# Patient Record
Sex: Female | Born: 1945 | Race: White | Hispanic: No | Smoking: Never smoker
Health system: Southern US, Community
[De-identification: ages and names within clinical notes are randomized; demographics above are authoritative.]

## PROBLEM LIST (undated history)

## (undated) DIAGNOSIS — A481 Legionnaires' disease: Secondary | ICD-10-CM

## (undated) DIAGNOSIS — E039 Hypothyroidism, unspecified: Secondary | ICD-10-CM

## (undated) DIAGNOSIS — F419 Anxiety disorder, unspecified: Secondary | ICD-10-CM

## (undated) DIAGNOSIS — E876 Hypokalemia: Secondary | ICD-10-CM

## (undated) DIAGNOSIS — R059 Cough, unspecified: Secondary | ICD-10-CM

## (undated) DIAGNOSIS — H3411 Central retinal artery occlusion, right eye: Secondary | ICD-10-CM

## (undated) DIAGNOSIS — K219 Gastro-esophageal reflux disease without esophagitis: Secondary | ICD-10-CM

## (undated) DIAGNOSIS — F32A Depression, unspecified: Secondary | ICD-10-CM

## (undated) DIAGNOSIS — F329 Major depressive disorder, single episode, unspecified: Secondary | ICD-10-CM

## (undated) DIAGNOSIS — I10 Essential (primary) hypertension: Secondary | ICD-10-CM

## (undated) DIAGNOSIS — R05 Cough: Secondary | ICD-10-CM

## (undated) DIAGNOSIS — E119 Type 2 diabetes mellitus without complications: Secondary | ICD-10-CM

## (undated) DIAGNOSIS — E785 Hyperlipidemia, unspecified: Secondary | ICD-10-CM

## (undated) HISTORY — DX: Central retinal artery occlusion, right eye: H34.11

## (undated) HISTORY — DX: Hypokalemia: E87.6

## (undated) HISTORY — DX: Cough: R05

## (undated) HISTORY — DX: Type 2 diabetes mellitus without complications: E11.9

## (undated) HISTORY — DX: Hyperlipidemia, unspecified: E78.5

## (undated) HISTORY — DX: Cough, unspecified: R05.9

---

## 1998-05-15 ENCOUNTER — Inpatient Hospital Stay (HOSPITAL_COMMUNITY): Admission: EM | Admit: 1998-05-15 | Discharge: 1998-05-16 | Payer: Self-pay | Admitting: Family Medicine

## 2000-01-10 ENCOUNTER — Encounter: Payer: Self-pay | Admitting: Obstetrics and Gynecology

## 2000-01-10 ENCOUNTER — Encounter: Admission: RE | Admit: 2000-01-10 | Discharge: 2000-01-10 | Payer: Self-pay | Admitting: Obstetrics and Gynecology

## 2002-06-11 ENCOUNTER — Emergency Department (HOSPITAL_COMMUNITY): Admission: EM | Admit: 2002-06-11 | Discharge: 2002-06-11 | Payer: Self-pay | Admitting: Emergency Medicine

## 2002-06-11 ENCOUNTER — Encounter: Payer: Self-pay | Admitting: Emergency Medicine

## 2002-06-21 ENCOUNTER — Encounter (HOSPITAL_COMMUNITY): Admission: RE | Admit: 2002-06-21 | Discharge: 2002-09-19 | Payer: Self-pay | Admitting: Family Medicine

## 2002-06-22 ENCOUNTER — Encounter: Payer: Self-pay | Admitting: Family Medicine

## 2002-06-28 ENCOUNTER — Encounter: Payer: Self-pay | Admitting: Family Medicine

## 2002-06-28 ENCOUNTER — Ambulatory Visit (HOSPITAL_COMMUNITY): Admission: RE | Admit: 2002-06-28 | Discharge: 2002-06-28 | Payer: Self-pay | Admitting: Family Medicine

## 2002-07-19 ENCOUNTER — Ambulatory Visit (HOSPITAL_COMMUNITY): Admission: RE | Admit: 2002-07-19 | Discharge: 2002-07-19 | Payer: Self-pay | Admitting: Family Medicine

## 2002-07-19 ENCOUNTER — Encounter: Payer: Self-pay | Admitting: Family Medicine

## 2002-07-19 ENCOUNTER — Encounter (INDEPENDENT_AMBULATORY_CARE_PROVIDER_SITE_OTHER): Payer: Self-pay | Admitting: *Deleted

## 2004-10-12 ENCOUNTER — Ambulatory Visit: Payer: Self-pay | Admitting: Family Medicine

## 2005-02-07 ENCOUNTER — Ambulatory Visit: Payer: Self-pay | Admitting: Family Medicine

## 2006-01-30 ENCOUNTER — Ambulatory Visit: Payer: Self-pay | Admitting: Family Medicine

## 2006-06-24 ENCOUNTER — Ambulatory Visit: Payer: Self-pay | Admitting: Family Medicine

## 2006-08-20 ENCOUNTER — Ambulatory Visit: Payer: Self-pay | Admitting: Family Medicine

## 2007-10-19 ENCOUNTER — Ambulatory Visit: Payer: Self-pay | Admitting: Internal Medicine

## 2007-10-19 DIAGNOSIS — R05 Cough: Secondary | ICD-10-CM

## 2007-11-02 ENCOUNTER — Ambulatory Visit: Payer: Self-pay | Admitting: Internal Medicine

## 2007-11-03 ENCOUNTER — Ambulatory Visit: Payer: Self-pay | Admitting: Cardiovascular Disease

## 2007-11-03 ENCOUNTER — Telehealth: Payer: Self-pay | Admitting: Internal Medicine

## 2007-11-05 ENCOUNTER — Ambulatory Visit: Payer: Self-pay | Admitting: Internal Medicine

## 2007-11-19 ENCOUNTER — Ambulatory Visit: Payer: Self-pay | Admitting: Internal Medicine

## 2007-12-01 ENCOUNTER — Telehealth: Payer: Self-pay | Admitting: Internal Medicine

## 2008-01-04 ENCOUNTER — Ambulatory Visit: Payer: Self-pay | Admitting: Internal Medicine

## 2008-01-04 ENCOUNTER — Encounter: Payer: Self-pay | Admitting: Internal Medicine

## 2008-01-04 DIAGNOSIS — I10 Essential (primary) hypertension: Secondary | ICD-10-CM | POA: Insufficient documentation

## 2008-02-08 ENCOUNTER — Ambulatory Visit: Payer: Self-pay | Admitting: Internal Medicine

## 2008-05-20 ENCOUNTER — Ambulatory Visit: Payer: Self-pay | Admitting: Internal Medicine

## 2008-08-08 ENCOUNTER — Ambulatory Visit: Payer: Self-pay | Admitting: Internal Medicine

## 2008-09-05 ENCOUNTER — Ambulatory Visit: Payer: Self-pay | Admitting: Internal Medicine

## 2008-09-05 ENCOUNTER — Telehealth (INDEPENDENT_AMBULATORY_CARE_PROVIDER_SITE_OTHER): Payer: Self-pay | Admitting: *Deleted

## 2008-09-20 ENCOUNTER — Telehealth (INDEPENDENT_AMBULATORY_CARE_PROVIDER_SITE_OTHER): Payer: Self-pay | Admitting: *Deleted

## 2008-09-26 ENCOUNTER — Ambulatory Visit: Payer: Self-pay | Admitting: Internal Medicine

## 2009-03-01 ENCOUNTER — Telehealth (INDEPENDENT_AMBULATORY_CARE_PROVIDER_SITE_OTHER): Payer: Self-pay | Admitting: *Deleted

## 2009-08-02 ENCOUNTER — Encounter: Payer: Self-pay | Admitting: Internal Medicine

## 2009-08-09 ENCOUNTER — Ambulatory Visit: Payer: Self-pay | Admitting: Internal Medicine

## 2009-09-25 ENCOUNTER — Encounter: Admission: RE | Admit: 2009-09-25 | Discharge: 2009-09-25 | Payer: Self-pay | Admitting: Family Medicine

## 2010-08-10 ENCOUNTER — Emergency Department (HOSPITAL_BASED_OUTPATIENT_CLINIC_OR_DEPARTMENT_OTHER): Admission: EM | Admit: 2010-08-10 | Discharge: 2010-08-10 | Payer: Self-pay | Admitting: Emergency Medicine

## 2010-08-10 ENCOUNTER — Ambulatory Visit: Payer: Self-pay | Admitting: Diagnostic Radiology

## 2010-08-14 ENCOUNTER — Encounter: Admission: RE | Admit: 2010-08-14 | Discharge: 2010-08-14 | Payer: Self-pay | Admitting: Orthopaedic Surgery

## 2011-05-14 ENCOUNTER — Other Ambulatory Visit: Payer: Self-pay | Admitting: Family Medicine

## 2011-05-14 DIAGNOSIS — Z1231 Encounter for screening mammogram for malignant neoplasm of breast: Secondary | ICD-10-CM

## 2011-05-30 ENCOUNTER — Ambulatory Visit: Payer: Self-pay

## 2011-08-07 ENCOUNTER — Ambulatory Visit: Payer: Self-pay

## 2011-12-23 ENCOUNTER — Ambulatory Visit
Admission: RE | Admit: 2011-12-23 | Discharge: 2011-12-23 | Disposition: A | Discharge status: Home / Self Care | Payer: Self-pay | Source: Ambulatory Visit | Attending: Family Medicine | Admitting: Family Medicine

## 2011-12-23 DIAGNOSIS — Z1231 Encounter for screening mammogram for malignant neoplasm of breast: Secondary | ICD-10-CM

## 2012-01-02 ENCOUNTER — Telehealth: Payer: Self-pay | Admitting: Internal Medicine

## 2012-01-02 NOTE — Telephone Encounter (Signed)
Spoke with Doroteo Bradford. She states pt can not speak b/c she is having so much diff with her breathing and also c/o cough. She is taking her to ED or UC right now. I have sched her for rov with MW to followup 01-15-12. Doroteo Bradford states nothing needed.

## 2012-01-14 ENCOUNTER — Encounter: Payer: Self-pay | Admitting: Internal Medicine

## 2012-01-14 ENCOUNTER — Other Ambulatory Visit: Payer: Self-pay | Admitting: Internal Medicine

## 2012-01-15 ENCOUNTER — Ambulatory Visit (INDEPENDENT_AMBULATORY_CARE_PROVIDER_SITE_OTHER): Payer: 59 | Admitting: Internal Medicine

## 2012-01-15 ENCOUNTER — Encounter: Payer: Self-pay | Admitting: Internal Medicine

## 2012-01-15 VITALS — BP 142/78 | HR 93 | Temp 98.1°F | Ht 60.0 in | Wt 166.6 lb

## 2012-01-15 DIAGNOSIS — J45909 Unspecified asthma, uncomplicated: Secondary | ICD-10-CM

## 2012-01-15 NOTE — Patient Instructions (Signed)
No change in maintenance medications  Work on perfecting  inhaler technique:  relax and gently blow all the way out then take a nice smooth deep breath back in, triggering the inhaler at same time you start breathing in.  Hold for up to 5 seconds if you can.  Rinse and gargle with water when done   If your mouth or throat starts to bother you,   I suggest you time the inhaler to your dental care and after using the inhaler(s) brush teeth and tongue with a baking soda containing toothpaste and when you rinse this out, gargle with it first to see if this helps your mouth and throat.     If you are satisfied with your treatment plan let your doctor know and he/she can either refill your medications or you can return here when your prescription runs out.     If in any way you are not 100% satisfied,  please tell us.  If 100% better, tell your friends!

## 2012-01-15 NOTE — Progress Notes (Signed)
Subjective:     Patient ID: Vickie Chang, female   DOB: Jan 14, 1946   MRN: [id number]  HPI  33 yowf  never smoker who apparently had pneumonia at the age of 36 months and since then has never been perfectly healthy in terms of respiratory complaints but much worse since she was diagnosed with mycoplasma pneumonia in [place] in 1984 - did not require intubation but apparently was quite sick.   Seen for pulmonary consult 10/19/2007 for persistent cough and wheezing. She was changed from Advair to Symbicort. CT sinus which was negative. .   02/08/08 ov reported breathing the best she's felt in years with minimum need for p.r.n.   September 05, 2008--returns for cough over last 4 weeks. Since flu shots she has had return of cough, has been having reaction to shots- with fatigue, malaise, cough, nausea. does not want cough to get as bad as before. rx with prednsone and tramadol, improved but not back to baseline, then relapsed but not as bad as was.   September 26, 2008 ov mostly night time coughing, never able to eliminate tramdol since it was originally started and using it as first line rather than as a backup as instucted.   August 09, 2009 cc f/u ov  did fine with maint qvar 40 2 bid and no need for ventolin even flew to [place] withot problems until sinus infection in late Oct 2010 . Pt c/o chest congestion, prod cough with green sputum x 2 days. She has already been txed with zpack and levaquin for "bronchitis". Also c/o fever for the past several nights. Presently finishing 750 (day 10 today) and also on prednisone. rec Work on hfa/ max gerd rx  01/15/2012 f/u ov/Vickie Chang cc recurrent cough> sob, acute onset, persistent daily > nightly, mostly dry , lost husband 11/2011, alot of stress,went to urgent care,started on antibotic,cough-better,sob better. No overt hb or sinus symptoms  Sleeping ok without nocturnal  or early am exacerbation  of respiratory  c/o's or need for noct saba. Also denies any  obvious fluctuation of symptoms with weather or environmental changes or other aggravating or alleviating factors except as outlined above   ROS  At present neg for  any significant sore throat, dysphagia, dental problems, itching, sneezing,  nasal congestion or excess/ purulent secretions, ear ache,   fever, chills, sweats, unintended wt loss, pleuritic or exertional cp, hemoptysis, palpitations, orthopnea pnd or leg swelling.  Also denies presyncope, palpitations, heartburn, abdominal pain, anorexia, nausea, vomiting, diarrhea  or change in bowel or urinary habits, change in stools or urine, dysuria,hematuria,  rash, arthralgias, visual complaints, headache, numbness weakness or ataxia or problems with walking or coordination. No noted change in mood/affect or memory.                         Allergies  1) ! Sulfa  2) ! Pcn  3) Codeine   Past Medical History:  INTRINSIC ASTHMA, UNSPECIFIED (ICD-493.10)  - HFA 100% effective September 26, 2008 > 25% August 09, 2009  COUGH (ICD-786.2)  - Sinus CT neg 11/03/07     Review of Systems     Objective:   Physical Exam    Hoarse anxious wf nad NAD wt 176 > 176 09/27/07 > 175 August 09, 2009 > 166 01/15/2012  HEENT: [place]/AT, , EACs-clear, TMs-wnl, NOSE-clear, THROAT-clear  NECK: Supple w/ fair ROM; no JVD; normal carotid impulses w/o bruits; no thyromegaly or nodules palpated; no lymphadenopathy.  CHEST: Clear to P & A; w/o, wheezes/ rales/ or rhonchi.no psuedowheezing  HEART: RRR, no m/r/g  ABDOMEN: Soft & nt; nml bowel sounds; no organomegaly or masses detected.  EXT: Warm bil, no calf pain, edema, clubbing, pulses intac Assessment:          Plan:

## 2012-01-16 NOTE — Assessment & Plan Note (Addendum)
Symptoms are markedly disproportionate to objective findings and not clear this is a lung problem but pt does appear to have difficult airway management issues.  DDX of  difficult airways managment all start with A and  include Adherence, Ace Inhibitors, Acid Reflux, Active Sinus Disease, Alpha 1 Antitripsin deficiency, Anxiety masquerading as Airways dz,  ABPA,  allergy(esp in young), Aspiration (esp in elderly), Adverse effects of DPI,  Active smokers, plus two Bs  = Bronchiectasis and Beta blocker use..and one C= CHF  Adherence is always the initial "prime suspect" and is a multilayered concern that requires a "trust but verify" approach in every patient - starting with knowing how to use medications, especially inhalers, correctly, keeping up with refills and understanding the fundamental difference between maintenance and prns vs those medications only taken for a very short course and then stopped and not refilled. The proper method of use, as well as anticipated side effects, of a metered-dose inhaler are discussed and demonstrated to the patient. Improved effectiveness after extensive coaching during this visit to a level of approximately  75%.  ? Acid reflux > reviewed diet/ rx.    Each maintenance medication was reviewed in detail including most importantly the difference between maintenance and as needed and under what circumstances the prns are to be used. This was done in the context of a medication calendar review which provided the patient with a user-friendly unambiguous mechanism for medication administration and reconciliation and provides an action plan for all active problems. It is critical that this be shown to every doctor  for modification during the office visit if necessary so the patient can use it as a working document.

## 2013-02-01 ENCOUNTER — Other Ambulatory Visit: Payer: Self-pay

## 2013-02-01 DIAGNOSIS — Z1231 Encounter for screening mammogram for malignant neoplasm of breast: Secondary | ICD-10-CM

## 2013-06-02 ENCOUNTER — Ambulatory Visit: Payer: 59

## 2014-01-19 ENCOUNTER — Ambulatory Visit: Payer: 59

## 2014-12-29 ENCOUNTER — Ambulatory Visit
Admission: RE | Admit: 2014-12-29 | Discharge: 2014-12-29 | Disposition: A | Discharge status: Home / Self Care | Payer: Medicare Other | Source: Ambulatory Visit

## 2014-12-29 DIAGNOSIS — Z1231 Encounter for screening mammogram for malignant neoplasm of breast: Secondary | ICD-10-CM

## 2015-01-02 ENCOUNTER — Other Ambulatory Visit: Payer: Self-pay | Admitting: Family Medicine

## 2015-01-02 DIAGNOSIS — R928 Other abnormal and inconclusive findings on diagnostic imaging of breast: Secondary | ICD-10-CM

## 2015-01-05 ENCOUNTER — Ambulatory Visit
Admission: RE | Admit: 2015-01-05 | Discharge: 2015-01-05 | Disposition: A | Discharge status: Home / Self Care | Payer: Medicare Other | Source: Ambulatory Visit | Attending: Family Medicine | Admitting: Family Medicine

## 2015-01-05 DIAGNOSIS — R928 Other abnormal and inconclusive findings on diagnostic imaging of breast: Secondary | ICD-10-CM

## 2015-04-27 ENCOUNTER — Inpatient Hospital Stay (HOSPITAL_BASED_OUTPATIENT_CLINIC_OR_DEPARTMENT_OTHER)
Admission: EM | Admit: 2015-04-27 | Discharge: 2015-04-28 | DRG: 123 | Disposition: A | Discharge status: Home / Self Care | Payer: Medicare Other | Attending: Internal Medicine | Admitting: Internal Medicine

## 2015-04-27 ENCOUNTER — Encounter (HOSPITAL_BASED_OUTPATIENT_CLINIC_OR_DEPARTMENT_OTHER): Payer: Self-pay | Admitting: *Deleted

## 2015-04-27 DIAGNOSIS — J45909 Unspecified asthma, uncomplicated: Secondary | ICD-10-CM | POA: Diagnosis not present

## 2015-04-27 DIAGNOSIS — H3411 Central retinal artery occlusion, right eye: Secondary | ICD-10-CM | POA: Diagnosis not present

## 2015-04-27 DIAGNOSIS — Z79899 Other long term (current) drug therapy: Secondary | ICD-10-CM | POA: Diagnosis not present

## 2015-04-27 DIAGNOSIS — E119 Type 2 diabetes mellitus without complications: Secondary | ICD-10-CM | POA: Diagnosis present

## 2015-04-27 DIAGNOSIS — E039 Hypothyroidism, unspecified: Secondary | ICD-10-CM | POA: Diagnosis present

## 2015-04-27 DIAGNOSIS — F32A Depression, unspecified: Secondary | ICD-10-CM | POA: Insufficient documentation

## 2015-04-27 DIAGNOSIS — I10 Essential (primary) hypertension: Secondary | ICD-10-CM | POA: Diagnosis not present

## 2015-04-27 DIAGNOSIS — H538 Other visual disturbances: Secondary | ICD-10-CM | POA: Diagnosis present

## 2015-04-27 DIAGNOSIS — J4520 Mild intermittent asthma, uncomplicated: Secondary | ICD-10-CM

## 2015-04-27 DIAGNOSIS — Z880 Allergy status to penicillin: Secondary | ICD-10-CM | POA: Diagnosis not present

## 2015-04-27 DIAGNOSIS — E876 Hypokalemia: Secondary | ICD-10-CM | POA: Diagnosis not present

## 2015-04-27 DIAGNOSIS — H349 Unspecified retinal vascular occlusion: Secondary | ICD-10-CM | POA: Diagnosis present

## 2015-04-27 DIAGNOSIS — E785 Hyperlipidemia, unspecified: Secondary | ICD-10-CM | POA: Diagnosis not present

## 2015-04-27 DIAGNOSIS — H539 Unspecified visual disturbance: Secondary | ICD-10-CM | POA: Insufficient documentation

## 2015-04-27 DIAGNOSIS — Z7982 Long term (current) use of aspirin: Secondary | ICD-10-CM

## 2015-04-27 DIAGNOSIS — G459 Transient cerebral ischemic attack, unspecified: Secondary | ICD-10-CM

## 2015-04-27 DIAGNOSIS — K219 Gastro-esophageal reflux disease without esophagitis: Secondary | ICD-10-CM | POA: Diagnosis present

## 2015-04-27 DIAGNOSIS — F419 Anxiety disorder, unspecified: Secondary | ICD-10-CM | POA: Diagnosis not present

## 2015-04-27 DIAGNOSIS — H3410 Central retinal artery occlusion, unspecified eye: Secondary | ICD-10-CM

## 2015-04-27 DIAGNOSIS — Z882 Allergy status to sulfonamides status: Secondary | ICD-10-CM | POA: Diagnosis not present

## 2015-04-27 DIAGNOSIS — F329 Major depressive disorder, single episode, unspecified: Secondary | ICD-10-CM | POA: Diagnosis not present

## 2015-04-27 DIAGNOSIS — Z885 Allergy status to narcotic agent status: Secondary | ICD-10-CM

## 2015-04-27 HISTORY — DX: Essential (primary) hypertension: I10

## 2015-04-27 HISTORY — DX: Gastro-esophageal reflux disease without esophagitis: K21.9

## 2015-04-27 HISTORY — DX: Anxiety disorder, unspecified: F41.9

## 2015-04-27 HISTORY — DX: Legionnaires' disease: A48.1

## 2015-04-27 HISTORY — DX: Hypothyroidism, unspecified: E03.9

## 2015-04-27 HISTORY — DX: Major depressive disorder, single episode, unspecified: F32.9

## 2015-04-27 HISTORY — DX: Depression, unspecified: F32.A

## 2015-04-27 LAB — COMPREHENSIVE METABOLIC PANEL
ALT: 27 U/L (ref 14–54)
AST: 32 U/L (ref 15–41)
Albumin: 4 g/dL (ref 3.5–5.0)
Alkaline Phosphatase: 54 U/L (ref 38–126)
Anion gap: 9 (ref 5–15)
BUN: 10 mg/dL (ref 6–20)
CO2: 28 mmol/L (ref 22–32)
CREATININE: 0.89 mg/dL (ref 0.44–1.00)
Calcium: 9.2 mg/dL (ref 8.9–10.3)
Chloride: 99 mmol/L — ABNORMAL LOW (ref 101–111)
GFR calc Af Amer: >60 mL/min (ref >60)
GFR calc non Af Amer: >60 mL/min (ref >60)
Glucose, Bld: 176 mg/dL — ABNORMAL HIGH (ref 65–99)
POTASSIUM: 3 mmol/L — AB (ref 3.5–5.1)
Sodium: 136 mmol/L (ref 135–145)
Total Bilirubin: 0.6 mg/dL (ref 0.3–1.2)
Total Protein: 6.4 g/dL — ABNORMAL LOW (ref 6.5–8.1)

## 2015-04-27 LAB — CBC WITH DIFFERENTIAL/PLATELET
BASOS ABS: 0 10*3/uL (ref 0.0–0.1)
Basophils Relative: 1 % (ref 0–1)
EOS ABS: 0.1 10*3/uL (ref 0.0–0.7)
Eosinophils Relative: 1 % (ref 0–5)
HCT: 42.4 % (ref 36.0–46.0)
Hemoglobin: 14.8 g/dL (ref 12.0–15.0)
LYMPHS PCT: 18 % (ref 12–46)
Lymphs Abs: 1.3 10*3/uL (ref 0.7–4.0)
MCH: 29.2 pg (ref 26.0–34.0)
MCHC: 34.9 g/dL (ref 30.0–36.0)
MCV: 83.6 fL (ref 78.0–100.0)
Monocytes Absolute: 0.6 10*3/uL (ref 0.1–1.0)
Monocytes Relative: 8 % (ref 3–12)
Neutro Abs: 5.4 10*3/uL (ref 1.7–7.7)
Neutrophils Relative %: 72 % (ref 43–77)
Platelets: 295 10*3/uL (ref 150–400)
RBC: 5.07 MIL/uL (ref 3.87–5.11)
RDW: 13.7 % (ref 11.5–15.5)
WBC: 7.4 10*3/uL (ref 4.0–10.5)

## 2015-04-27 LAB — C-REACTIVE PROTEIN: CRP: <0.5 mg/dL (ref <1.0)

## 2015-04-27 LAB — PROTIME-INR
INR: 1.12 (ref 0.00–1.49)
Prothrombin Time: 14.6 seconds (ref 11.6–15.2)

## 2015-04-27 LAB — SEDIMENTATION RATE: Sed Rate: 6 mm/hr (ref 0–22)

## 2015-04-27 NOTE — ED Provider Notes (Signed)
CSN: [id number]     Arrival date & time 04/27/15  1825 History  This chart was scribed for Veryl Speak, MD by Julien Nordmann, ED Scribe. This patient was seen in room MH10/MH10 and the patient's care was started at 6:45 PM.    Chief Complaint  Patient presents with  . Visual Field Change     Patient is a 69 y.o. female presenting with eye problem. The history is provided by the patient. No language interpreter was used.  Eye Problem Location:  R eye Quality:  Unable to specify Severity:  Moderate Onset quality:  Sudden Duration:  2 days Timing:  Unable to specify Progression:  Unchanged Chronicity:  New Relieved by:  None tried Worsened by:  Nothing tried Ineffective treatments:  None tried Associated symptoms: blurred vision    HPI Comments: Vickie Chang is a 69 y.o. female who presents to the Emergency Department complaining of a sudden onset vision field change in her right eye onset last night. Pt reports she was watching television last night "heard something pop and saw a lavender snow flake come across her eye". 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 Pt states she take a baby aspirin a day. She has a family hx of having strokes, prominently on her fathers side. Pt denies hx of afib and irregular heart beart.   PCP: Dr. Edrick Oh  Past Medical History  Diagnosis Date  . Intrinsic asthma   . Cough    History reviewed. No pertinent past surgical history. Family History  Problem Relation Age of Onset  . Asthma Sister    History  Substance Use Topics  . Smoking status: Never Smoker   . Smokeless tobacco: Not on file  . Alcohol Use: No   OB History    No data available     Review of Systems  Eyes: Positive for blurred vision and visual disturbance.  All other systems reviewed and are negative.     Allergies  Codeine; Penicillins; and Sulfonamide derivatives  Home Medications   Prior to Admission  medications   Medication Sig Start Date End Date Taking? Authorizing Provider  acetaminophen (TYLENOL) 325 MG tablet Take 650 mg by mouth every 6 (six) hours as needed.    Historical Provider, MD  albuterol (PROAIR HFA) 108 (90 BASE) MCG/ACT inhaler Inhale 2 puffs into the lungs every 4 (four) hours as needed.    Historical Provider, MD  aliskiren (TEKTURNA) 150 MG tablet Take 150 mg by mouth daily.    Historical Provider, MD  ALPRAZolam Duanne Moron) 0.25 MG tablet Take 0.25 mg by mouth every 12 (twelve) hours as needed.    Historical Provider, MD  Alum & Mag Hydroxide-Simeth (MAGIC MOUTHWASH) SOLN As directed as needed    Historical Provider, MD  amLODipine-atorvastatin (CADUET) 5-40 MG per tablet Take 1 tablet by mouth daily.    Historical Provider, MD  aspirin 81 MG tablet Take 81 mg by mouth daily.    Historical Provider, MD  azelastine (OPTIVAR) 0.05 % ophthalmic solution Place 2 drops into both eyes daily as needed.    Historical Provider, MD  Beclomethasone Dipropionate (QVAR IN) Inhale 2 puffs into the lungs 2 (two) times daily.    Historical Provider, MD  buPROPion (ZYBAN) 150 MG 12 hr tablet Take 450 mg by mouth daily.    Historical Provider, MD  Calcium-Vitamin D (CALTRATE 600 PLUS-VIT D PO) Take 3 tablets by mouth daily.  Historical Provider, MD  clotrimazole (LOTRIMIN) 1 % cream Apply 1 application topically 2 (two) times daily as needed.    Historical Provider, MD  dexlansoprazole (DEXILANT) 60 MG capsule Take 60 mg by mouth daily.    Historical Provider, MD  dextromethorphan (DELSYM) 30 MG/5ML liquid 2 tsp. Every 12 hrs. As needed    Historical Provider, MD  dextromethorphan-guaiFENesin [facility] DM) 30-600 MG per 12 hr tablet Take 1 tablet by mouth every 12 (twelve) hours.    Historical Provider, MD  Eszopiclone (ESZOPICLONE) 3 MG TABS Take 3 mg by mouth at bedtime. Take immediately before bedtime    Historical Provider, MD  FLUoxetine (PROZAC) 20 MG capsule Take 20 mg by mouth daily.     Historical Provider, MD  glipiZIDE (GLUCOTROL) 5 MG tablet Take 5 mg by mouth daily.    Historical Provider, MD  lansoprazole (PREVACID) 15 MG capsule Take 15 mg by mouth 2 (two) times daily.    Historical Provider, MD  levothyroxine (SYNTHROID, LEVOTHROID) 100 MCG tablet Take 100 mcg by mouth daily.    Historical Provider, MD  meloxicam (MOBIC) 7.5 MG tablet Take 7.5 mg by mouth 2 (two) times daily as needed.    Historical Provider, MD  Multiple Vitamins-Minerals (CENTRUM SILVER ULTRA WOMENS PO) Take 1 tablet by mouth daily.    Historical Provider, MD  oxymetazoline (AFRIN) 0.05 % nasal spray Place 2 sprays into the nose 2 (two) times daily. Max 5 days only    Historical Provider, MD  penciclovir (DENAVIR) 1 % cream Apply 1 application topically 2 (two) times daily as needed.    Historical Provider, MD  potassium chloride SA (K-DUR,KLOR-CON) 20 MEQ tablet Take 60 mEq by mouth daily.    Historical Provider, MD  Probiotic Product (ALIGN PO) Take 1 capsule by mouth daily.    Historical Provider, MD  scopolamine (TRANSDERM-SCOP) 1.5 MG Place 1 patch onto the skin daily as needed.    Historical Provider, MD  traMADol (ULTRAM) 50 MG tablet 1 -2 every 4 hrs. As needed    Historical Provider, MD  triamcinolone (NASACORT) 55 MCG/ACT nasal inhaler Place 2 sprays into the nose 2 (two) times daily.    Historical Provider, MD  Valsartan (DIOVAN PO) Take 1 tablet by mouth daily.    Historical Provider, MD   Triage vitals: BP 184/84 mmHg  Pulse 85  Temp(Src) 98.2 F (36.8 C) (Oral)  Resp 18  Ht 5' (1.524 m)  Wt 162 lb (73.483 kg)  BMI 31.64 kg/m2  SpO2 99%  Physical Exam  Constitutional: She is oriented to person, place, and time. She appears well-developed and well-nourished. No distress.  HENT:  Head: Normocephalic and atraumatic.  No autable carotid Bruyn's   Eyes: EOM are normal. Pupils are equal, round, and reactive to light.  No obvious abnormality on Funduscopic exam  Neck: Normal range of  motion.  Cardiovascular: Normal rate, regular rhythm and normal heart sounds.   Pulmonary/Chest: Effort normal and breath sounds normal.  Abdominal: Soft. She exhibits no distension. There is no tenderness.  Musculoskeletal: Normal range of motion.  Neurological: She is alert and oriented to person, place, and time. No cranial nerve deficit. She exhibits normal muscle tone. Coordination normal.  Skin: Skin is warm and dry.  Psychiatric: She has a normal mood and affect. Judgment normal.  Nursing note and vitals reviewed.   ED Course  Procedures  DIAGNOSTIC STUDIES: Oxygen Saturation is 99% on RA, normal by my interpretation.  COORDINATION OF CARE: 6:53 PM  Discussed treatment plan which includes talk with neurology, lab work with pt at bedside and pt agreed to plan.  Labs Review Labs Reviewed  COMPREHENSIVE METABOLIC PANEL  CBC WITH DIFFERENTIAL/PLATELET  PROTIME-INR  C-REACTIVE PROTEIN  SEDIMENTATION RATE    Imaging Review No results found.   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 The patient was evaluated by her ophthalmologist as well as a Dr. Oval Linsey who is a retinal surgeon prior to being sent here. She appears neurologically intact otherwise. Her workup so far reveals a sinus rhythm and unremarkable laboratory studies. I've discussed this case with Dr. Doy Mince from neurology who is recommending admission to the hospitalist service. Dr. Blaine Hamper agrees to admit.  CRITICAL CARE Performed by: Veryl Speak Total critical care time: 30 minutes Critical  care time was exclusive of separately billable procedures and treating other patients. Critical care was necessary to treat or prevent imminent or life-threatening deterioration. Critical care was time spent personally by me on the following activities: development of treatment plan with patient and/or surrogate as well as nursing, discussions with consultants, evaluation of patient's response to treatment, examination of patient, obtaining history from patient or surrogate, ordering and performing treatments and interventions, ordering and review of laboratory studies, ordering and review of radiographic studies, pulse oximetry and re-evaluation of patient's condition.   I personally performed the services described in this documentation, which was scribed in my presence. The recorded information has been reviewed and is accurate.    Veryl Speak, MD 04/27/15 2043

## 2015-04-27 NOTE — ED Notes (Signed)
Pt reports that she had vision change last night in her right eye.  Vision became blurred and lavender in color'.  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  No other deficits noted.

## 2015-04-27 NOTE — ED Notes (Signed)
Pt sent by opthamalogist today, who sent her to ER to be evaluated.

## 2015-04-27 NOTE — ED Notes (Signed)
MD at bedside. 

## 2015-04-28 ENCOUNTER — Observation Stay (HOSPITAL_COMMUNITY): Payer: Medicare Other

## 2015-04-28 ENCOUNTER — Observation Stay (HOSPITAL_BASED_OUTPATIENT_CLINIC_OR_DEPARTMENT_OTHER): Payer: Medicare Other

## 2015-04-28 ENCOUNTER — Encounter (HOSPITAL_COMMUNITY): Payer: Self-pay | Admitting: Internal Medicine

## 2015-04-28 DIAGNOSIS — G459 Transient cerebral ischemic attack, unspecified: Secondary | ICD-10-CM

## 2015-04-28 DIAGNOSIS — H3411 Central retinal artery occlusion, right eye: Secondary | ICD-10-CM | POA: Diagnosis not present

## 2015-04-28 DIAGNOSIS — E785 Hyperlipidemia, unspecified: Secondary | ICD-10-CM | POA: Insufficient documentation

## 2015-04-28 DIAGNOSIS — F419 Anxiety disorder, unspecified: Secondary | ICD-10-CM

## 2015-04-28 DIAGNOSIS — H538 Other visual disturbances: Secondary | ICD-10-CM | POA: Diagnosis not present

## 2015-04-28 DIAGNOSIS — I10 Essential (primary) hypertension: Secondary | ICD-10-CM | POA: Diagnosis not present

## 2015-04-28 DIAGNOSIS — E876 Hypokalemia: Secondary | ICD-10-CM | POA: Diagnosis not present

## 2015-04-28 DIAGNOSIS — F329 Major depressive disorder, single episode, unspecified: Secondary | ICD-10-CM

## 2015-04-28 LAB — LIPID PANEL
Cholesterol: 185 mg/dL (ref 0–200)
HDL: 50 mg/dL (ref >40)
LDL CALC: 108 mg/dL — AB (ref 0–99)
TRIGLYCERIDES: 134 mg/dL (ref <150)
Total CHOL/HDL Ratio: 3.7 RATIO
VLDL: 27 mg/dL (ref 0–40)

## 2015-04-28 LAB — GLUCOSE, CAPILLARY
GLUCOSE-CAPILLARY: 135 mg/dL — AB (ref 65–99)
GLUCOSE-CAPILLARY: 152 mg/dL — AB (ref 65–99)
Glucose-Capillary: 122 mg/dL — ABNORMAL HIGH (ref 65–99)

## 2015-04-28 MED ORDER — POTASSIUM CHLORIDE CRYS ER 20 MEQ PO TBCR
20.0000 meq | EXTENDED_RELEASE_TABLET | Freq: Once | ORAL | Status: AC
  Administered 2015-04-28: 20 meq via ORAL
  Filled 2015-04-28: qty 1

## 2015-04-28 MED ORDER — NEBIVOLOL HCL 5 MG PO TABS: 5.0000 mg | ORAL_TABLET | Freq: Every day | ORAL | Status: AC

## 2015-04-28 MED ORDER — ATORVASTATIN CALCIUM 20 MG PO TABS: 20.0000 mg | ORAL_TABLET | Freq: Every day | ORAL | Status: AC

## 2015-04-28 MED ORDER — ASPIRIN 325 MG PO TABS
325.0000 mg | ORAL_TABLET | Freq: Every day | ORAL | Status: DC
  Administered 2015-04-28: 325 mg via ORAL
  Filled 2015-04-28: qty 1

## 2015-04-28 MED ORDER — INSULIN ASPART 100 UNIT/ML ~~LOC~~ SOLN
0.0000 [IU] | Freq: Three times a day (TID) | SUBCUTANEOUS | Status: DC
  Administered 2015-04-28: 1 [IU] via SUBCUTANEOUS
  Administered 2015-04-28: 2 [IU] via SUBCUTANEOUS
  Administered 2015-04-28: 1 [IU] via SUBCUTANEOUS
  Filled 2015-04-28: qty 1

## 2015-04-28 MED ORDER — ENOXAPARIN SODIUM 40 MG/0.4ML ~~LOC~~ SOLN
40.0000 mg | SUBCUTANEOUS | Status: DC
Start: 2015-04-28 — End: 2015-04-28
  Administered 2015-04-28: 40 mg via SUBCUTANEOUS
  Filled 2015-04-28: qty 0.4

## 2015-04-28 MED ORDER — ASPIRIN EC 325 MG PO TBEC: 325.0000 mg | DELAYED_RELEASE_TABLET | Freq: Every day | ORAL | Status: DC

## 2015-04-28 MED ORDER — IBUPROFEN 600 MG PO TABS
ORAL_TABLET | ORAL | Status: AC
  Administered 2015-04-28: 600 mg via ORAL
  Filled 2015-04-28: qty 1

## 2015-04-28 MED ORDER — LOSARTAN POTASSIUM 100 MG PO TABS: 100.0000 mg | ORAL_TABLET | Freq: Every day | ORAL | Status: AC

## 2015-04-28 MED ORDER — IBUPROFEN 600 MG PO TABS
600.0000 mg | ORAL_TABLET | Freq: Once | ORAL | Status: AC
Start: 2015-04-28 — End: 2015-04-28
  Administered 2015-04-28: 600 mg via ORAL

## 2015-04-28 MED ORDER — ATORVASTATIN CALCIUM 20 MG PO TABS
20.0000 mg | ORAL_TABLET | Freq: Every day | ORAL | Status: DC
  Administered 2015-04-28: 20 mg via ORAL
  Filled 2015-04-28: qty 1

## 2015-04-28 MED ORDER — ACETAMINOPHEN 325 MG PO TABS
650.0000 mg | ORAL_TABLET | Freq: Four times a day (QID) | ORAL | Status: DC | PRN
  Administered 2015-04-28: 650 mg via ORAL
  Filled 2015-04-28: qty 2

## 2015-04-28 MED ORDER — IOHEXOL 350 MG/ML SOLN
60.0000 mL | Freq: Once | INTRAVENOUS | Status: AC | PRN
  Administered 2015-04-28: 60 mL via INTRAVENOUS

## 2015-04-28 MED ORDER — POTASSIUM CHLORIDE CRYS ER 20 MEQ PO TBCR
40.0000 meq | EXTENDED_RELEASE_TABLET | Freq: Once | ORAL | Status: AC
  Administered 2015-04-28: 40 meq via ORAL
  Filled 2015-04-28: qty 2

## 2015-04-28 MED ORDER — STROKE: EARLY STAGES OF RECOVERY BOOK
Freq: Once | Status: AC
  Administered 2015-04-28: 1
  Filled 2015-04-28: qty 1

## 2015-04-28 NOTE — H&P (Signed)
History and Physical  Vickie LIBERTO MMN:[id number] DOB: March 31, 1946 DOA: 04/27/2015  Referring physician: Dr. Veryl Speak, EDP PCP: Sherrie Mustache, MD  Outpatient Specialists:  1. Opthalmology  Chief Complaint: Visual disturbance of right eye  HPI: Vickie Chang is a 69 y.o. 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 She was in her usual state of health until the night of 04/26/15 when while doing her laundry and steaming some clothes, she felt a pop in her right eye followed by blurred vision-felt like she was seen through [place]. She denies double vision. She has been having intermittent right periorbital headache of varying severe fatigue for the last 2 weeks, relieved by when necessary Tylenol. She denied associated slurred speech, facial droop, asymmetric limb numbness or weakness. She went to bed and yesterday morning her right eye symptoms had resolved. However after about 40 minutes, she had recurrence of symptoms. She went to see her ophthalmologist and underwent extensive evaluation and was told to have right central retinal artery occlusion concerning for embolic phenomenon i.e. TIA and to rule out other etiology such as temporal arteritis. She was advised to go to the ED for further evaluation and hence was transferred from [facility] to [facility] In the ED, lab work significant for potassium 3, glucose 176. Patient states that her right eye symptoms have gradually improved but have not resolved yet.   Review of Systems: All systems reviewed and apart from history of presenting illness, are negative.  Past Medical History  Diagnosis Date  . Intrinsic asthma   . Cough   .  Legionnaire's disease     1984  . GERD (gastroesophageal reflux disease)   . Hypothyroidism   . Depression   . Anxiety    History reviewed. No pertinent past surgical history. Social History:  reports that she has never smoked. She does not have any smokeless tobacco history on file. She reports that she does not drink alcohol or use illicit drugs. Single. Independent of activities of daily living.  Allergies  Allergen Reactions  . Codeine     REACTION: gi upset  . Penicillins     REACTION: swelling  . Sulfonamide Derivatives     REACTION: hives    Family History  Problem Relation Age of Onset  . Asthma Sister   . Heart attack Father   . Stroke Father    Strong family history of MI and strokes at young age and her dad eventually passed off in his early 19s from heart related illness.  Prior to Admission medications   Medication Sig Start Date End Date Taking? Authorizing Provider  acetaminophen (TYLENOL) 325 MG tablet Take 650 mg by mouth every 6 (six) hours as needed.    Historical Provider, MD  albuterol (PROAIR HFA) 108 (90 BASE) MCG/ACT inhaler Inhale 2 puffs into the lungs every 4 (four) hours as needed.    Historical Provider, MD  ALPRAZolam Duanne Moron) 0.25 MG tablet Take 0.25 mg by mouth every 12 (twelve) hours as needed.    Historical Provider, MD  aspirin 81 MG tablet Take 81 mg by mouth daily.    Historical Provider, MD  buPROPion (ZYBAN) 150 MG 12 hr tablet Take 450 mg by mouth daily.  Historical Provider, MD  Calcium-Vitamin D (CALTRATE 600 PLUS-VIT D PO) Take 3 tablets by mouth daily.    Historical Provider, MD  dexlansoprazole (DEXILANT) 60 MG capsule Take 60 mg by mouth daily.    Historical Provider, MD  dextromethorphan (DELSYM) 30 MG/5ML liquid 2 tsp. Every 12 hrs. As needed    Historical Provider, MD  dextromethorphan-guaiFENesin [facility] DM) 30-600 MG per 12 hr tablet Take 1 tablet by mouth every 12 (twelve) hours.    Historical Provider, MD  Eszopiclone  (ESZOPICLONE) 3 MG TABS Take 3 mg by mouth at bedtime. Take immediately before bedtime    Historical Provider, MD  FLUoxetine (PROZAC) 20 MG capsule Take 20 mg by mouth daily.    Historical Provider, MD  levothyroxine (SYNTHROID, LEVOTHROID) 100 MCG tablet Take 100 mcg by mouth daily.    Historical Provider, MD  meloxicam (MOBIC) 7.5 MG tablet Take 7.5 mg by mouth 2 (two) times daily as needed.    Historical Provider, MD  Multiple Vitamins-Minerals (CENTRUM SILVER ULTRA WOMENS PO) Take 1 tablet by mouth daily.    Historical Provider, MD  oxymetazoline (AFRIN) 0.05 % nasal spray Place 2 sprays into the nose 2 (two) times daily. Max 5 days only    Historical Provider, MD  penciclovir (DENAVIR) 1 % cream Apply 1 application topically 2 (two) times daily as needed.    Historical Provider, MD  potassium chloride SA (K-DUR,KLOR-CON) 20 MEQ tablet Take 60 mEq by mouth daily.    Historical Provider, MD  Probiotic Product (ALIGN PO) Take 1 capsule by mouth daily.    Historical Provider, MD  scopolamine (TRANSDERM-SCOP) 1.5 MG Place 1 patch onto the skin daily as needed.    Historical Provider, MD  traMADol (ULTRAM) 50 MG tablet 1 -2 every 4 hrs. As needed    Historical Provider, MD  triamcinolone (NASACORT) 55 MCG/ACT nasal inhaler Place 2 sprays into the nose 2 (two) times daily.    Historical Provider, MD   Physical Exam: Filed Vitals:   04/27/15 1921 04/27/15 2100 04/27/15 2216 04/27/15 2322  BP: 167/80 189/95 169/85 175/97  Pulse: 79 77 77 78  Temp:  98.2 F (36.8 C)  98.5 F (36.9 C)  TempSrc:  Oral  Oral  Resp: 16 18 16 16   Height:    5' (1.524 m)  Weight:    73.5 kg (162 lb 0.6 oz)  SpO2: 94% 100% 99% 99%     General exam: Moderately built and nourished  pleasant middle-aged female patiSitting up comfortably in bed in no obvious distress.  Head, eyes and ENT: Nontraumatic and normocephalic. Pupils equally reacting to light and accommodation. Oral mucosa moist.  Neck: Supple. No JVD,  carotid bruit or thyromegaly.  Lymphatics: No lymphadenopathy.  Respiratory system: Clear to auscultation. No increased work of breathing.  Cardiovascular system: S1 and S2 heard, RRR. No JVD, murmurs, gallops, clicks or pedal edema.  Gastrointestinal system: Abdomen is nondistended, soft and nontender. Normal bowel sounds heard. No organomegaly or masses appreciated.  Central nervous system: Alert and oriented. No focal neurological deficits.  Extremities: Symmetric 5 x 5 power. Peripheral pulses symmetrically felt.   Skin: No rashes or acute findings.  Musculoskeletal system: Negative exam.  Psychiatry: Pleasant and cooperative.   Labs on Admission:  Basic Metabolic Panel:  Recent Labs Lab 04/27/15 1855  NA 136  K 3.0*  CL 99*  CO2 28  GLUCOSE 176*  BUN 10  CREATININE 0.89  CALCIUM 9.2   Liver Function Tests:  Recent  Labs Lab 04/27/15 1855  AST 32  ALT 27  ALKPHOS 54  BILITOT 0.6  PROT 6.4*  ALBUMIN 4.0   No results for input(s): LIPASE, AMYLASE in the last 168 hours. No results for input(s): AMMONIA in the last 168 hours. CBC:  Recent Labs Lab 04/27/15 1855  WBC 7.4  NEUTROABS 5.4  HGB 14.8  HCT 42.4  MCV 83.6  PLT 295   Cardiac Enzymes: No results for input(s): CKTOTAL, CKMB, CKMBINDEX, TROPONINI in the last 168 hours.  BNP (last 3 results) No results for input(s): PROBNP in the last 8760 hours. CBG: No results for input(s): GLUCAP in the last 168 hours.  Radiological Exams on Admission: No results found.  EKG: Independently reviewed. Sinus rhythm, normal axis and no acute changes. QTC 462 ms.  Assessment/Plan Principal Problem:   Central retinal artery occlusion of right eye Active Problems:   HYPERTENSION, BENIGN   Intrinsic asthma   Hypothyroidism   GERD (gastroesophageal reflux disease)   TIA (transient ischemic attack)   Hypokalemia   1. Possible TIA: Patient was recently diagnosed by her ophthalmologist with right  central retinal artery occlusion and concern for embolic phenomenon i.e. TIA and sent to the hospital for further evaluation. Neurology has been consulted. Complete TIA workup: MRI & MRA head, 2-D echo, carotid Dopplers, fasting lipids and hemoglobin A1c. Patient was on aspirin 81 MG daily PTA and now will be changed to 325 MG daily. Last known well 04/26/15 night. Improving. 2. Right central retinal artery occlusion: TIA workup as above. Low index of suspicion for temporal arteritis with normal CRP (less than 0.5 & ESR 6). Continue aspirin. Outpatient follow-up with ophthalmologist. Improving.  3. Hypokalemia: Replace and follow 4. Essential hypertension: Mildly uncontrolled. Allow for permissive hypertension in the context of problem #1. Consider resuming some of her home medications after medications have been reconciled by pharmacy. 5. Diet-controlled type II DM: Check A1c. NovoLog SSI. 6. Hyperlipidemia: Check fasting lipids. Consider starting statins based on LDL levels. 7. GERD: PPI 8. Hypothyroidism: Synthroid. 9. Asthma: Stable.    DVT prophylaxis: Lovenox  Code Status: Full  Family Communication: discussed with patient's son and daughter at bedside. Disposition Plan: DC home after completing workup, possibly later on 04/28/15. Patient indicates that she has to leave today because she has purchased nonrefundable tickets to a [employer] show in [place].  Time spent: 67 minutes   Daron Breeding, MD, FACP, FHM. [facility] Pager [phone]  If 7PM-7AM, please contact night-coverage [email or web address] Password [email or web address] 04/28/2015, 1:40 AM

## 2015-04-28 NOTE — Care Management Note (Signed)
Case Management Note  Patient Details  Name: JENAFER WINTERTON MRN: [id number] Date of Birth: 01-Mar-1946  Subjective/Objective:                 Patient from home; self care. Has history of CVA, placed in observation for concerns of new CVA. Patient planning on leaving for [place] for vacation in next few days. Awaiting recommendation from PT.    Action/Plan:  Will continue to follow.  Expected Discharge Date:                  Expected Discharge Plan:  [place]  In-House Referral:     Discharge planning Services  CM Consult  Post Acute Care Choice:    Choice offered to:     DME Arranged:    DME Agency:     HH Arranged:    [place] Agency:     Status of Service:  In process, will continue to follow  Medicare Important Message Given:    Date Medicare IM Given:    Medicare IM give by:    Date Additional Medicare IM Given:    Additional Medicare Important Message give by:     If discussed at [place] of Stay Meetings, dates discussed:    Additional Comments:  Carles Collet, RN 04/28/2015, 11:09 AM

## 2015-04-28 NOTE — Progress Notes (Signed)
  Echocardiogram 2D Echocardiogram has been performed.  Vickie Chang M 04/28/2015, 12:21 PM

## 2015-04-28 NOTE — Consult Note (Signed)
Referring Physician: [place]    Chief Complaint: Right eye decreased vision.    HPI: Vickie Chang is an 69 y.o. female who reports that about three weeks ago she had worsening of right sided headaches.  Headaches were more frequent and more severe.  They were right sided and radiated down the right jaw.  On Wednesday night while steaming clothes she felt the sensation of a "pop".  After that sensation she felt as if she was "looking through a snowflake" with her right eye.  She went to the ophthalmologist on yesterday and was diagnosed with a CRAO and referred to the ED for further work up.    Date last known well: Date: 04/26/2015 Time last known well: Time: 21:00 tPA Given: No: Outside time window  Past Medical History  Diagnosis Date  . Intrinsic asthma   . Cough   . Legionnaire's disease     1984  . GERD (gastroesophageal reflux disease)   . Hypothyroidism   . Depression   . Anxiety     History reviewed. No pertinent past surgical history.  Family History  Problem Relation Age of Onset  . Asthma Sister   . Heart attack Father   . Stroke Father    Social History:  reports that she has never smoked. She does not have any smokeless tobacco history on file. She reports that she does not drink alcohol or use illicit drugs.  Allergies:  Allergies  Allergen Reactions  . Codeine     REACTION: gi upset  . Penicillins     REACTION: swelling  . Sulfonamide Derivatives     REACTION: hives    Medications:  I have reviewed the patient's current medications. Prior to Admission:  Prescriptions prior to admission  Medication Sig Dispense Refill Last Dose  . acetaminophen (TYLENOL) 325 MG tablet Take 650 mg by mouth every 6 (six) hours as needed.   Taking  . albuterol (PROAIR HFA) 108 (90 BASE) MCG/ACT inhaler Inhale 2 puffs into the lungs every 4 (four) hours as needed.   Taking  . ALPRAZolam (XANAX) 0.25 MG tablet Take 0.25 mg by mouth every 12 (twelve) hours as needed.    Taking  . aspirin 81 MG tablet Take 81 mg by mouth daily.   Taking  . buPROPion (ZYBAN) 150 MG 12 hr tablet Take 450 mg by mouth daily.   Taking  . Calcium-Vitamin D (CALTRATE 600 PLUS-VIT D PO) Take 3 tablets by mouth daily.   Taking  . dexlansoprazole (DEXILANT) 60 MG capsule Take 60 mg by mouth daily.   Taking  . dextromethorphan (DELSYM) 30 MG/5ML liquid 2 tsp. Every 12 hrs. As needed   Taking  . dextromethorphan-guaiFENesin (MUCINEX DM) 30-600 MG per 12 hr tablet Take 1 tablet by mouth every 12 (twelve) hours.   Taking  . Eszopiclone (ESZOPICLONE) 3 MG TABS Take 3 mg by mouth at bedtime. Take immediately before bedtime   Taking  . FLUoxetine (PROZAC) 20 MG capsule Take 20 mg by mouth daily.   Taking  . levothyroxine (SYNTHROID, LEVOTHROID) 100 MCG tablet Take 100 mcg by mouth daily.   Taking  . meloxicam (MOBIC) 7.5 MG tablet Take 7.5 mg by mouth 2 (two) times daily as needed.   Taking  . Multiple Vitamins-Minerals (CENTRUM SILVER ULTRA WOMENS PO) Take 1 tablet by mouth daily.   Taking  . oxymetazoline (AFRIN) 0.05 % nasal spray Place 2 sprays into the nose 2 (two) times daily. Max 5 days only  Taking  . penciclovir (DENAVIR) 1 % cream Apply 1 application topically 2 (two) times daily as needed.   Taking  . potassium chloride SA (K-DUR,KLOR-CON) 20 MEQ tablet Take 60 mEq by mouth daily.   Taking  . Probiotic Product (ALIGN PO) Take 1 capsule by mouth daily.   Taking  . scopolamine (TRANSDERM-SCOP) 1.5 MG Place 1 patch onto the skin daily as needed.   Taking  . traMADol (ULTRAM) 50 MG tablet 1 -2 every 4 hrs. As needed   Taking  . triamcinolone (NASACORT) 55 MCG/ACT nasal inhaler Place 2 sprays into the nose 2 (two) times daily.   Taking   Scheduled: . aspirin  325 mg Oral Daily  . enoxaparin (LOVENOX) injection  40 mg Subcutaneous Q24H  . insulin aspart  0-9 Units Subcutaneous TID WC    ROS: History obtained from the patient  General ROS: negative for - chills, fatigue, fever,  night sweats, weight gain or weight loss Psychological ROS: negative for - behavioral disorder, hallucinations, memory difficulties, mood swings or suicidal ideation Ophthalmic ROS: negative for - blurry vision, double vision, eye pain or loss of vision ENT ROS: negative for - epistaxis, nasal discharge, oral lesions, sore throat, tinnitus or vertigo Allergy and Immunology ROS: negative for - hives or itchy/watery eyes Hematological and Lymphatic ROS: negative for - bleeding problems, bruising or swollen lymph nodes Endocrine ROS: negative for - galactorrhea, hair pattern changes, polydipsia/polyuria or temperature intolerance Respiratory ROS: negative for - cough, hemoptysis, shortness of breath or wheezing Cardiovascular ROS: negative for - chest pain, dyspnea on exertion, edema or irregular heartbeat Gastrointestinal ROS: negative for - abdominal pain, diarrhea, hematemesis, nausea/vomiting or stool incontinence Genito-Urinary ROS: negative for - dysuria, hematuria, incontinence or urinary frequency/urgency Musculoskeletal ROS: left thumb tenderness Neurological ROS: as noted in HPI Dermatological ROS: negative for rash and skin lesion changes  Physical Examination: Blood pressure 175/97, pulse 78, temperature 98.5 F (36.9 C), temperature source Oral, resp. rate 16, height 5' (1.524 m), weight 73.5 kg (162 lb 0.6 oz), SpO2 99 %.  HEENT-  Normocephalic, no lesions, without obvious abnormality.  Normal external eye and conjunctiva.  Normal TM's bilaterally.  Normal auditory canals and external ears. Normal external nose, mucus membranes and septum.  Normal pharynx. Cardiovascular- S1, S2 normal, pulses palpable throughout   Lungs- chest clear, no wheezing, rales, normal symmetric air entry Abdomen- soft, non-tender; bowel sounds normal; no masses,  no organomegaly Extremities- no edema Lymph-no adenopathy palpable Musculoskeletal-no joint tenderness, deformity or swelling Skin-warm and  dry, no hyperpigmentation, vitiligo, or suspicious lesions  Neurological Examination Mental Status: Alert, oriented, thought content appropriate.  Speech fluent without evidence of aphasia.  Able to follow 3 step commands without difficulty. Cranial Nerves: II: Discs flat bilaterally; Decreased vision right eye, pupils equal, round, reactive to light and accommodation with the right eye being more sluggish than the left. III,IV, VI: ptosis not present, extra-ocular motions intact bilaterally V,VII: smile symmetric, facial light touch sensation normal bilaterally VIII: hearing normal bilaterally IX,X: gag reflex present XI: bilateral shoulder shrug XII: midline tongue extension Motor: Right : Upper extremity   5/5    Left:     Upper extremity   5/5  Lower extremity   5/5     Lower extremity   5/5 Tone and bulk:normal tone throughout; no atrophy noted Sensory: Pinprick and light touch intact throughout, bilaterally Deep Tendon Reflexes: 2+ and symmetric with absent AJ's bilaterally Plantars: Right: downgoing   Left: downgoing Cerebellar: normal finger-to-nose  and normal heel-to-shin testing bilaterally Gait: not tested    Laboratory Studies:  Basic Metabolic Panel:  Recent Labs Lab 04/27/15 1855  NA 136  K 3.0*  CL 99*  CO2 28  GLUCOSE 176*  BUN 10  CREATININE 0.89  CALCIUM 9.2    Liver Function Tests:  Recent Labs Lab 04/27/15 1855  AST 32  ALT 27  ALKPHOS 54  BILITOT 0.6  PROT 6.4*  ALBUMIN 4.0   No results for input(s): LIPASE, AMYLASE in the last 168 hours. No results for input(s): AMMONIA in the last 168 hours.  CBC:  Recent Labs Lab 04/27/15 1855  WBC 7.4  NEUTROABS 5.4  HGB 14.8  HCT 42.4  MCV 83.6  PLT 295    Cardiac Enzymes: No results for input(s): CKTOTAL, CKMB, CKMBINDEX, TROPONINI in the last 168 hours.  BNP: Invalid input(s): POCBNP  CBG: No results for input(s): GLUCAP in the last 168 hours.  Microbiology: No results found  for this or any previous visit.  Coagulation Studies:  Recent Labs  04/27/15 1855  LABPROT 14.6  INR 1.12    Urinalysis: No results for input(s): COLORURINE, LABSPEC, PHURINE, GLUCOSEU, HGBUR, BILIRUBINUR, KETONESUR, PROTEINUR, UROBILINOGEN, NITRITE, LEUKOCYTESUR in the last 168 hours.  Invalid input(s): APPERANCEUR  Lipid Panel: No results found for: CHOL, TRIG, HDL, CHOLHDL, VLDL, LDLCALC  HgbA1C: No results found for: HGBA1C  Urine Drug Screen:  No results found for: LABOPIA, COCAINSCRNUR, LABBENZ, AMPHETMU, THCU, LABBARB  Alcohol Level: No results for input(s): ETH in the last 168 hours.  Other results: EKG: sinus rhythm at 83 bpm.  Imaging: No results found.  Assessment: 69 y.o. female presenting with a right CRAO.  Concern is for disease of the right carotid but would like to rule out TA as well with worsening headache.  Remaining neurological examination is unremarkable and patient has remained stable.  Further work up recommended.  Patient on ASA 34m at home prior to admission.  Stroke Risk Factors - none  Plan: 1. HgbA1c, fasting lipid panel, CRP, ESR 2. MRI, MRA  of the brain without contrast 3. PT consult, OT consult, Speech consult 4. Echocardiogram 5. Carotid dopplers 6. Prophylactic therapy-Antiplatelet med: Aspirin - dose 3242mdaily 7. NPO until RN stroke swallow screen 8. Telemetry monitoring 9. Frequent neuro checks    LeAlexis GoodellMD [facility] [id number]/01/2015, 5:13 AM

## 2015-04-28 NOTE — Discharge Summary (Addendum)
Physician Discharge Summary  Vickie Chang JSH:[id number] DOB: 07/07/1946 DOA: 04/27/2015  PCP: Sherrie Mustache, MD  Admit date: 04/27/2015 Discharge date: 04/28/2015  Recommendations for Outpatient Follow-up:  1. Pt will need to follow up with PCP in 1 week post discharge 2. Please obtain BMP in one week 3. Please follow up on results of hemoglobin A1c  Discharge Diagnoses:  1. Transient visual disturbance: Patient was recently diagnosed by her ophthalmologist with right central retinal artery occlusion and concern for embolic phenomenon i.e. TIA and sent to the hospital for further evaluation. Neurology has been consulted. Complete TIA workup: MRI & MRA head were negative for acute stroke or large vessel stenosis., fasting lipids (LDL 108) and hemoglobin A1c (pending at time of d/c). Patient was on aspirin 81 MG daily PTA and now will be changed to 325 MG daily. Last known well 04/26/15 night.  Pt to be d/c with ASA 325 mg daily.  CT angiogram of the neck was ordered by neurology--negative for cervical carotid stenosis, but did show severe right A2 and right M2 stenosis. This was discussed with neurology who felt the patient was stable for discharge. Echocardiogram showed no source of intracardiac thrombi. EF was 85-02%, grade 1 diastolic dysfunction without any significant valvular abnormalities. 2. Right central retinal artery occlusion: TIA workup as above. Low index of suspicion for temporal arteritis with normal CRP (less than 0.5 & ESR 6). Continue aspirin. Outpatient follow-up with ophthalmologist. Improving.  3. Hypokalemia: Replace and follow 4. Essential hypertension: Mildly uncontrolled. Allow for permissive hypertension in the context of problem #1. Patient was instructed to restart her losartan 100 mg daily, and bystolic 5 mg daily 5. Diet-controlled type II DM: Check A1c. NovoLog SSI. Please follow up with a resultant hemoglobin A1c 6. Hyperlipidemia: Check fasting lipids--LDL  108, start Lipitor 36m daily. D/C Wellchol 7. GERD: PPI 8. Hypothyroidism: Synthroid. 9. Asthma: Stable.  Discharge Condition: stable  Disposition: home  Diet:cardiac Wt Readings from Last 3 Encounters:  04/27/15 73.5 kg (162 lb 0.6 oz)  01/15/12 75.569 kg (166 lb 9.6 oz)  08/09/09 79.379 kg (175 lb)    History of present illness:  69y.o. 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 She was in her usual state of health until the night of 04/26/15 when while doing her laundry and steaming some clothes, she felt a pop in her right eye followed by blurred vision-felt like she was seen through [employer]. She denies double vision. She has been having intermittent right periorbital headache of varying severe fatigue for the last 2 weeks, relieved by when necessary Tylenol. She denied associated slurred speech, facial droop, asymmetric limb numbness or weakness. She went to bed and yesterday morning her right eye symptoms had resolved. However after about 40 minutes, she had recurrence of symptoms. She went to see her ophthalmologist and underwent extensive evaluation and was told to have right central retinal artery occlusion concerning for embolic phenomenon i.e. TIA and to rule out other etiology such as temporal arteritis. She was advised to go to the ED for further evaluation and [facility]  The patient was evaluated by neurology. She underwent a full neurologic workup. MRI of the brain was negative for acute stroke.  CT and a gram of the neck was performed was negative for any cervical carotid stenosis. The case was discussed with neurology who felt the patient was stable  for discharge. The patient will be discharged with aspirin 325 mg daily. She was instructed to restart her Bystolic and losartan.. Consultants: Neurology  Discharge Exam: Filed Vitals:   04/28/15 1318  BP: 167/72  Pulse: 80  Temp: 98.2 F (36.8 C)  Resp: 20   Filed Vitals:   04/27/15 2216 04/27/15 2322 04/28/15 0530 04/28/15 1318  BP: 169/85 175/97 172/77 167/72  Pulse: 77 78 76 80  Temp:  98.5 F (36.9 C) 98.4 F (36.9 C) 98.2 F (36.8 C)  TempSrc:  Oral Oral Oral  Resp: 16 16 16 20   Height:  5' (1.524 m)    Weight:  73.5 kg (162 lb 0.6 oz)    SpO2: 99% 99% 100% 100%   General: A&O x 3, NAD, pleasant, cooperative Cardiovascular: RRR, no rub, no gallop, no S3 Respiratory: CTAB, no wheeze, no rhonchi Abdomen:soft, nontender, nondistended, positive bowel sounds Extremities: No edema, No lymphangitis, no petechiae  Discharge Instructions      Discharge Instructions    Diet - low sodium heart healthy    Complete by:  As directed      Increase activity slowly    Complete by:  As directed             Medication List    STOP taking these medications        aspirin 81 MG tablet  Replaced by:  aspirin EC 325 MG tablet     meloxicam 7.5 MG tablet  Commonly known as:  MOBIC      TAKE these medications        acetaminophen 325 MG tablet  Commonly known as:  TYLENOL  Take 650 mg by mouth every 6 (six) hours as needed.     ALIGN PO  Take 1 capsule by mouth daily.     ALPRAZolam 0.25 MG tablet  Commonly known as:  XANAX  Take 0.25 mg by mouth every 12 (twelve) hours as needed.     aspirin EC 325 MG tablet  Take 1 tablet (325 mg total) by mouth daily.     atorvastatin 20 MG tablet  Commonly known as:  LIPITOR  Take 1 tablet (20 mg total) by mouth daily at 6 PM.     buPROPion 150 MG 12 hr tablet  Commonly known as:  ZYBAN  Take 450 mg by mouth daily.     CALTRATE 600 PLUS-VIT D PO  Take 3 tablets by mouth daily.     CENTRUM SILVER ULTRA WOMENS PO    Take 1 tablet by mouth daily.     DEXILANT 60 MG capsule  Generic drug:  dexlansoprazole  Take 60 mg by mouth daily.     dextromethorphan 30 MG/5ML liquid  Commonly known as:  DELSYM  2 tsp. Every 12 hrs. As needed     dextromethorphan-guaiFENesin 30-600 MG per 12 hr tablet  Commonly known as:  MUCINEX DM  Take 1 tablet by mouth every 12 (twelve) hours.     eszopiclone 3 MG Tabs  Generic drug:  Eszopiclone  Take 3 mg by mouth at bedtime. Take immediately before bedtime     FLUoxetine 20 MG capsule  Commonly known as:  PROZAC  Take 20 mg by mouth daily.     levothyroxine 100 MCG tablet  Commonly known as:  SYNTHROID, LEVOTHROID  Take  100 mcg by mouth daily.     losartan 100 MG tablet  Commonly known as:  COZAAR  Take 1 tablet (100 mg total) by mouth daily.     nebivolol 5 MG tablet  Commonly known as:  BYSTOLIC  Take 1 tablet (5 mg total) by mouth daily.     oxymetazoline 0.05 % nasal spray  Commonly known as:  AFRIN  Place 2 sprays into the nose 2 (two) times daily. Max 5 days only     penciclovir 1 % cream  Commonly known as:  DENAVIR  Apply 1 application topically 2 (two) times daily as needed.     potassium chloride SA 20 MEQ tablet  Commonly known as:  K-DUR,KLOR-CON  Take 60 mEq by mouth daily.     PROAIR HFA 108 (90 BASE) MCG/ACT inhaler  Generic drug:  albuterol  Inhale 2 puffs into the lungs every 4 (four) hours as needed.     scopolamine 1 MG/3DAYS  Commonly known as:  TRANSDERM-SCOP  Place 1 patch onto the skin daily as needed.     traMADol 50 MG tablet  Commonly known as:  ULTRAM  1 -2 every 4 hrs. As needed     triamcinolone 55 MCG/ACT nasal inhaler  Commonly known as:  NASACORT  Place 2 sprays into the nose 2 (two) times daily.         The results of significant diagnostics from this hospitalization (including imaging, microbiology, ancillary and laboratory) are listed below for reference.    Significant Diagnostic Studies: Ct Angio  Head W/cm &/or Wo Cm  04/28/2015   CLINICAL DATA:  Central retinal artery occlusion. Right eye blurry vision and headaches.  EXAM: CT ANGIOGRAPHY HEAD AND NECK  TECHNIQUE: Multidetector CT imaging of the head and neck was performed using the standard protocol during bolus administration of intravenous contrast. Multiplanar CT image reconstructions and MIPs were obtained to evaluate the vascular anatomy. Carotid stenosis measurements (when applicable) are obtained utilizing NASCET criteria, using the distal internal carotid diameter as the denominator.  CONTRAST:  10m OMNIPAQUE IOHEXOL 350 MG/ML SOLN  COMPARISON:  Brain MRI earlier today  FINDINGS: CT HEAD  Brain: There is no evidence of acute cortical infarct, intracranial hemorrhage, mass, midline shift, or extra-axial fluid collection. Ventricles and sulci are normal for age. Patchy hypodensities in the subcortical and deep cerebral white matter are nonspecific but compatible with moderate chronic small vessel ischemic disease.  Calvarium and skull base: No skull fracture or destructive osseous lesion.  Paranasal sinuses: Clear.  Orbits: Unremarkable.  CTA NECK  Aortic arch: Incidental note is made of a common origin of the brachiocephalic and left common carotid arteries, a normal variant. Mild aortic arch atherosclerosis. Brachiocephalic and subclavian arteries are widely patent.  Right carotid system: Patent without evidence of stenosis, dissection, or aneurysm. Minimal irregularity of the distal cervical ICA.  Left carotid system: Patent without evidence of stenosis, dissection, or aneurysm. Very mild irregularity of the distal cervical ICA.  Vertebral arteries: Patent with the right vertebral artery being extremely dominant. Left vertebral artery is markedly hypoplastic diffusely. No vertebral artery stenosis is seen.  Skeleton: Advanced degenerative changes at the right sternoclavicular joint. Multilevel cervical spondylosis greatest at C5-6.  Other neck:  1.7 cm heterogeneously attenuating right thyroid nodule with some calcification, similar in size to that reported on a 06/28/2002 ultrasound and followed by biopsy at that time.  CTA HEAD  Anterior circulation: Internal carotid arteries are patent from skullbase to carotid termini without  stenosis. Ophthalmic artery origins are grossly patent. A1 and M1 segments are patent without stenosis. There is a moderate to severe focal right A2 stenosis. There is a proximal right M2 stenosis. No intracranial aneurysm is identified.  Posterior circulation: Dominant distal right vertebral artery without stenosis. PICA origins are patent. AICA and SCA origins are patent. Basilar artery is patent without stenosis. PCAs are unremarkable. Posterior communicating arteries are not identified.  Venous sinuses: Patent.  Anatomic variants: None.  Delayed phase: No abnormal enhancement.  IMPRESSION: 1. No major intracranial arterial occlusion. 2. 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 3. No cervical carotid artery stenosis. Dominant right vertebral artery.   Electronically Signed   By: Logan Bores   On: 04/28/2015 17:15   Dg Chest 2 View  04/28/2015   CLINICAL DATA:  TA, visual changes LEFT eye, slight headache, history hypertension  EXAM: CHEST  2 VIEW  COMPARISON:  08/09/2009  FINDINGS: Upper normal heart size.  Normal mediastinal contours and pulmonary vascularity.  Minimal chronic peribronchial thickening.  Lungs clear.  No pleural effusion or pneumothorax.  Bones unremarkable.  IMPRESSION: No acute abnormalities.   Electronically Signed   By: Lavonia Dana M.D.   On: 04/28/2015 09:01   Ct Angio Neck W/cm &/or Wo/cm  04/28/2015   CLINICAL DATA:  Central retinal artery occlusion. Right eye blurry vision and headaches.  EXAM: CT ANGIOGRAPHY HEAD AND NECK  TECHNIQUE: Multidetector CT imaging of the head and neck was performed using the standard protocol during bolus administration of intravenous contrast. Multiplanar CT  image reconstructions and MIPs were obtained to evaluate the vascular anatomy. Carotid stenosis measurements (when applicable) are obtained utilizing NASCET criteria, using the distal internal carotid diameter as the denominator.  CONTRAST:  23m OMNIPAQUE IOHEXOL 350 MG/ML SOLN  COMPARISON:  Brain MRI earlier today  FINDINGS: CT HEAD  Brain: There is no evidence of acute cortical infarct, intracranial hemorrhage, mass, midline shift, or extra-axial fluid collection. Ventricles and sulci are normal for age. Patchy hypodensities in the subcortical and deep cerebral white matter are nonspecific but compatible with moderate chronic small vessel ischemic disease.  Calvarium and skull base: No skull fracture or destructive osseous lesion.  Paranasal sinuses: Clear.  Orbits: Unremarkable.  CTA NECK  Aortic arch: Incidental note is made of a common origin of the brachiocephalic and left common carotid arteries, a normal variant. Mild aortic arch atherosclerosis. Brachiocephalic and subclavian arteries are widely patent.  Right carotid system: Patent without evidence of stenosis, dissection, or aneurysm. Minimal irregularity of the distal cervical ICA.  Left carotid system: Patent without evidence of stenosis, dissection, or aneurysm. Very mild irregularity of the distal cervical ICA.  Vertebral arteries: Patent with the right vertebral artery being extremely dominant. Left vertebral artery is markedly hypoplastic diffusely. No vertebral artery stenosis is seen.  Skeleton: Advanced degenerative changes at the right sternoclavicular joint. Multilevel cervical spondylosis greatest at C5-6.  Other neck: 1.7 cm heterogeneously attenuating right thyroid nodule with some calcification, similar in size to that reported on a 06/28/2002 ultrasound and followed by biopsy at that time.  CTA HEAD  Anterior circulation: Internal carotid arteries are patent from skullbase to carotid termini without stenosis. Ophthalmic artery origins are  grossly patent. A1 and M1 segments are patent without stenosis. There is a moderate to severe focal right A2 stenosis. There is a proximal right M2 stenosis. No intracranial aneurysm is identified.  Posterior circulation: Dominant distal right vertebral artery without stenosis. PICA origins are patent.  AICA and SCA origins are patent. Basilar artery is patent without stenosis. PCAs are unremarkable. Posterior communicating arteries are not identified.  Venous sinuses: Patent.  Anatomic variants: None.  Delayed phase: No abnormal enhancement.  IMPRESSION: 1. No major intracranial arterial occlusion. 2. 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 3. No cervical carotid artery stenosis. Dominant right vertebral artery.   Electronically Signed   By: Logan Bores   On: 04/28/2015 17:15   Mr Brain Wo Contrast  04/28/2015   CLINICAL DATA:  Possible TIA. Acute onset right eye visual disturbance 04/26/2015 with symptoms now improved.  EXAM: MRI HEAD WITHOUT CONTRAST  TECHNIQUE: Multiplanar, multiecho pulse sequences of the brain and surrounding structures were obtained without intravenous contrast.  COMPARISON:  Head CT 08/10/2010  FINDINGS: There is no evidence of acute infarct, mass, midline shift, or extra-axial fluid collection. Patchy and confluent T2 hyperintensities in the subcortical and deep cerebral white matter and pons are nonspecific but compatible with moderate chronic small vessel ischemic disease. Foci of chronic microhemorrhage are noted in the superior left cerebellum, lateral right thalamus/ internal capsule, and medial high right frontal lobe. Ventricles and sulci are within normal limits for age.  Orbits are unremarkable. Paranasal sinuses are clear. There is trace left mastoid fluid. Distal left vertebral artery appears markedly hypoplastic. Other major intracranial vascular flow voids are preserved.  IMPRESSION: 1. No acute intracranial abnormality. 2. Moderate chronic small vessel ischemic  disease.   Electronically Signed   By: Logan Bores   On: 04/28/2015 11:02     Microbiology: No results found for this or any previous visit (from the past 240 hour(s)).   Labs: Basic Metabolic Panel:  Recent Labs Lab 04/27/15 1855  NA 136  K 3.0*  CL 99*  CO2 28  GLUCOSE 176*  BUN 10  CREATININE 0.89  CALCIUM 9.2   Liver Function Tests:  Recent Labs Lab 04/27/15 1855  AST 32  ALT 27  ALKPHOS 54  BILITOT 0.6  PROT 6.4*  ALBUMIN 4.0   No results for input(s): LIPASE, AMYLASE in the last 168 hours. No results for input(s): AMMONIA in the last 168 hours. CBC:  Recent Labs Lab 04/27/15 1855  WBC 7.4  NEUTROABS 5.4  HGB 14.8  HCT 42.4  MCV 83.6  PLT 295   Cardiac Enzymes: No results for input(s): CKTOTAL, CKMB, CKMBINDEX, TROPONINI in the last 168 hours. BNP: Invalid input(s): POCBNP CBG:  Recent Labs Lab 04/28/15 0752 04/28/15 1140 04/28/15 1702  GLUCAP 135* 152* 122*    Time coordinating discharge:  Greater than 30 minutes  Signed:  Mikenna Bunkley, DO [facility] Pager: [phone] 04/28/2015, 5:52 PM

## 2015-04-28 NOTE — Plan of Care (Signed)
Problem: Acute Treatment Outcomes Goal: BP within ordered parameters Outcome: Completed/Met Date Met:  04/28/15 Pt's bp remained greater than 160/70 throughout the day. Pt was not ordered any BP meds during her stay at the hospital, but does take BP meds at home daily.

## 2015-04-28 NOTE — Progress Notes (Signed)
STROKE TEAM PROGRESS NOTE   HISTORY Vickie Chang is a 69 y.o. female who reports that about three weeks ago she had worsening of right sided headaches. Headaches were more frequent and more severe. They were right sided and radiated down the right jaw. On Wednesday night while steaming clothes she felt the sensation of a "pop". After that sensation she felt as if she was "looking through a snowflake" with her right eye. She went to the ophthalmologist on yesterday and was diagnosed with a CRAO and referred to the ED for further work up.    Date last known well: Date: 04/26/2015 Time last known well: Time: 21:00 tPA Given: No: Outside time window   SUBJECTIVE (INTERVAL HISTORY) The patient's daughter is at the bedside. Dr. Marti Sleigh reviewed the patient's history of present illness with the patient. He performed an ophthalmoscopic exam at the bedside. The patient states that her vision is now back to normal. She is very anxious for discharge as she is scheduled to fly to [place] tomorrow.   OBJECTIVE Temp:  [98.2 F (36.8 C)-98.5 F (36.9 C)] 98.2 F (36.8 C) (08/05 1318) Pulse Rate:  [76-85] 80 (08/05 1318) Cardiac Rhythm:  [-]  Resp:  [16-20] 20 (08/05 1318) BP: (167-189)/(72-97) 167/72 mmHg (08/05 1318) SpO2:  [94 %-100 %] 100 % (08/05 1318) Weight:  [73.483 kg (162 lb)-73.5 kg (162 lb 0.6 oz)] 73.5 kg (162 lb 0.6 oz) (08/04 2322)   Recent Labs Lab 04/28/15 0752 04/28/15 1140  GLUCAP 135* 152*    Recent Labs Lab 04/27/15 1855  NA 136  K 3.0*  CL 99*  CO2 28  GLUCOSE 176*  BUN 10  CREATININE 0.89  CALCIUM 9.2    Recent Labs Lab 04/27/15 1855  AST 32  ALT 27  ALKPHOS 54  BILITOT 0.6  PROT 6.4*  ALBUMIN 4.0    Recent Labs Lab 04/27/15 1855  WBC 7.4  NEUTROABS 5.4  HGB 14.8  HCT 42.4  MCV 83.6  PLT 295   No results for input(s): CKTOTAL, CKMB, CKMBINDEX, TROPONINI in the last 168 hours.  Recent Labs  04/27/15 1855  LABPROT 14.6  INR 1.12    No results for input(s): COLORURINE, LABSPEC, PHURINE, GLUCOSEU, HGBUR, BILIRUBINUR, KETONESUR, PROTEINUR, UROBILINOGEN, NITRITE, LEUKOCYTESUR in the last 72 hours.  Invalid input(s): APPERANCEUR     Component Value Date/Time   CHOL 185 04/28/2015 0541   TRIG 134 04/28/2015 0541   HDL 50 04/28/2015 0541   CHOLHDL 3.7 04/28/2015 0541   VLDL 27 04/28/2015 0541   LDLCALC 108* 04/28/2015 0541   No results found for: HGBA1C No results found for: LABOPIA, COCAINSCRNUR, LABBENZ, AMPHETMU, THCU, LABBARB  No results for input(s): ETH in the last 168 hours.    Imaging  Dg Chest 2 View 04/28/2015    No acute abnormalities.    Mr Brain Wo Contrast 04/28/2015    1. No acute intracranial abnormality.  2. Moderate chronic small vessel ischemic disease.     CTA Head and Neck -  1. No major intracranial arterial occlusion. 2. 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 3. No cervical carotid artery stenosis. Dominant right vertebral artery.  2D echo - Normal LV size with mild focal basal septal hypertrophy. EF 60-65%. Normal RV size and systolic function. Borderline pulmonary hypertension. No significant valvular abnormalities.  PHYSICAL EXAM  Temp:  [98.2 F (36.8 C)-98.5 F (36.9 C)] 98.2 F (36.8 C) (08/05 1318) Pulse Rate:  [76-80] 80 (08/05 1318)  Resp:  [16-20] 20 (08/05 1318) BP: (167-189)/(72-97) 167/72 mmHg (08/05 1318) SpO2:  [94 %-100 %] 100 % (08/05 1318) Weight:  [162 lb 0.6 oz (73.5 kg)] 162 lb 0.6 oz (73.5 kg) (08/04 2322)  General - Well nourished, well developed, in no apparent distress.  Ophthalmologic - Sharp disc margins OS with normal CRAs, however, not able to see through the right eye due to small pupil.   Cardiovascular - Regular rate and rhythm with no murmur.  Mental Status -  Level of arousal and orientation to time, place, and person were intact. Language including expression, naming, repetition, comprehension was assessed and found  intact. Fund of Knowledge was assessed and was intact.  Cranial Nerves II - XII - II - Visual field intact OU. III, IV, VI - Extraocular movements intact. V - Facial sensation intact bilaterally. VII - Facial movement intact bilaterally. VIII - Hearing & vestibular intact bilaterally. X - Palate elevates symmetrically. XI - Chin turning & shoulder shrug intact bilaterally. XII - Tongue protrusion intact.  Motor Strength - The patient's strength was normal in all extremities and pronator drift was absent.  Bulk was normal and fasciculations were absent.   Motor Tone - Muscle tone was assessed at the neck and appendages and was normal.  Reflexes - The patient's reflexes were 1+ in all extremities and she had no pathological reflexes.  Sensory - Light touch, temperature/pinprick were assessed and were symmetrical.    Coordination - The patient had normal movements in the hands and feet with no ataxia or dysmetria.  Tremor was absent.  Gait and Station - The patient's transfers, posture, gait, station, and turns were observed as normal.  ASSESSMENT/PLAN Ms. Vickie Chang is a 69 y.o. female with history of sinus headaches, asthma, anxiety and depression presenting with a transient visual disturbance . She did not receive IV t-PA due to late presentation and resolution of deficits.  Central retinal artery occlusion - unknown etiology.  Resultant  resolution of deficits  MRI  no acute intracranial abnormality. Moderate chronic small vessel ischemic disease  CTA head and neck - bilateral ICA unremarkable. Mild to moderate right A2 and right M2 stenoses  2D Echo EF 60-65%  LDL 108  HgbA1c pending  Lovenox for VTE prophylaxis Diet heart healthy/carb modified Room service appropriate?: Yes; Fluid consistency:: Thin  aspirin 81 mg orally every day prior to admission, now on aspirin 325 mg orally every day. Continue ASA 325 on discharge.  Patient counseled to be compliant with  her antithrombotic medications  Ongoing aggressive stroke risk factor management  Therapy recommendations: No need.   Disposition:  Discharge when workup is complete.  Hypertension  Home meds: No antihypertensives medications prior to admission. No history of hypertension.  Blood pressure mildly high  Outpatient follow-up  Hyperlipidemia  Home meds:   WelChol prior to admission; however patient admits she was not taking the medication as prescribed secondary to the cost.  LDL 108, goal < 70  Add 79m Lipitor (discontinue WelChol)  Continue statin at discharge  Other Stroke Risk Factors  Advanced age  Obesity, Body mass index is 31.65 kg/(m^2).   Family hx stroke (her father had 15 strokes per patient's report)  Other Active Problems  Hypokalemia - treated  Other Pertinent History    Hospital day # 1  Neurology will sign off. Please call with questions. [facility] Thanks for the consult.  JRosalin Hawking MD  PhD Stroke Neurology 04/28/2015 7:04 PM   To contact Stroke Continuity provider, please refer to [email or web address] After hours, contact General Neurology

## 2015-04-29 LAB — HEMOGLOBIN A1C
HEMOGLOBIN A1C: 6.2 % — AB (ref 4.8–5.6)
Mean Plasma Glucose: 131 mg/dL

## 2015-05-22 ENCOUNTER — Encounter: Payer: Self-pay | Admitting: Physician Assistant

## 2015-05-26 ENCOUNTER — Ambulatory Visit (HOSPITAL_COMMUNITY)
Admission: RE | Admit: 2015-05-26 | Discharge: 2015-05-26 | Disposition: A | Discharge status: Home / Self Care | Payer: Medicare Other | Source: Ambulatory Visit | Attending: Cardiology | Admitting: Cardiology

## 2015-05-26 ENCOUNTER — Ambulatory Visit (HOSPITAL_COMMUNITY)
Admission: RE | Admit: 2015-05-26 | Discharge: 2015-05-26 | Disposition: A | Discharge status: Home / Self Care | Payer: Medicare Other | Source: Ambulatory Visit | Attending: Cardiovascular Disease | Admitting: Cardiovascular Disease

## 2015-05-26 ENCOUNTER — Encounter (HOSPITAL_COMMUNITY)
Admission: RE | Disposition: A | Discharge status: Home / Self Care | Payer: Self-pay | Source: Ambulatory Visit | Attending: Cardiovascular Disease

## 2015-05-26 ENCOUNTER — Encounter (HOSPITAL_COMMUNITY): Payer: Self-pay | Admitting: Cardiology

## 2015-05-26 DIAGNOSIS — E785 Hyperlipidemia, unspecified: Secondary | ICD-10-CM | POA: Diagnosis present

## 2015-05-26 DIAGNOSIS — I10 Essential (primary) hypertension: Secondary | ICD-10-CM | POA: Insufficient documentation

## 2015-05-26 DIAGNOSIS — E039 Hypothyroidism, unspecified: Secondary | ICD-10-CM | POA: Diagnosis present

## 2015-05-26 DIAGNOSIS — E119 Type 2 diabetes mellitus without complications: Secondary | ICD-10-CM | POA: Insufficient documentation

## 2015-05-26 DIAGNOSIS — H3410 Central retinal artery occlusion, unspecified eye: Secondary | ICD-10-CM | POA: Diagnosis not present

## 2015-05-26 DIAGNOSIS — K219 Gastro-esophageal reflux disease without esophagitis: Secondary | ICD-10-CM | POA: Diagnosis present

## 2015-05-26 DIAGNOSIS — Z8249 Family history of ischemic heart disease and other diseases of the circulatory system: Secondary | ICD-10-CM | POA: Diagnosis not present

## 2015-05-26 DIAGNOSIS — F32A Depression, unspecified: Secondary | ICD-10-CM | POA: Diagnosis present

## 2015-05-26 DIAGNOSIS — Z7982 Long term (current) use of aspirin: Secondary | ICD-10-CM | POA: Insufficient documentation

## 2015-05-26 DIAGNOSIS — H3411 Central retinal artery occlusion, right eye: Secondary | ICD-10-CM | POA: Diagnosis not present

## 2015-05-26 DIAGNOSIS — Z880 Allergy status to penicillin: Secondary | ICD-10-CM | POA: Insufficient documentation

## 2015-05-26 DIAGNOSIS — F329 Major depressive disorder, single episode, unspecified: Secondary | ICD-10-CM | POA: Diagnosis present

## 2015-05-26 DIAGNOSIS — I313 Pericardial effusion (noninflammatory): Secondary | ICD-10-CM | POA: Diagnosis not present

## 2015-05-26 DIAGNOSIS — J45909 Unspecified asthma, uncomplicated: Secondary | ICD-10-CM | POA: Diagnosis present

## 2015-05-26 HISTORY — PX: TEE WITHOUT CARDIOVERSION: SHX5443

## 2015-05-26 SURGERY — ECHOCARDIOGRAM, TRANSESOPHAGEAL
Anesthesia: Moderate Sedation

## 2015-05-26 MED ORDER — BUTAMBEN-TETRACAINE-BENZOCAINE 2-2-14 % EX AERO
INHALATION_SPRAY | CUTANEOUS | Status: DC | PRN
  Administered 2015-05-26: 2 via TOPICAL

## 2015-05-26 MED ORDER — MIDAZOLAM HCL 10 MG/2ML IJ SOLN
INTRAMUSCULAR | Status: DC | PRN
  Administered 2015-05-26 (×3): 2 mg via INTRAVENOUS

## 2015-05-26 MED ORDER — FENTANYL CITRATE (PF) 100 MCG/2ML IJ SOLN
INTRAMUSCULAR | Status: DC | PRN
  Administered 2015-05-26 (×3): 25 ug via INTRAVENOUS

## 2015-05-26 MED ORDER — FENTANYL CITRATE (PF) 100 MCG/2ML IJ SOLN
INTRAMUSCULAR | Status: AC
  Filled 2015-05-26: qty 2

## 2015-05-26 MED ORDER — DIPHENHYDRAMINE HCL 50 MG/ML IJ SOLN
INTRAMUSCULAR | Status: AC
  Filled 2015-05-26: qty 1

## 2015-05-26 MED ORDER — MIDAZOLAM HCL 5 MG/ML IJ SOLN
INTRAMUSCULAR | Status: AC
  Filled 2015-05-26: qty 2

## 2015-05-26 NOTE — Discharge Instructions (Signed)
Transesophageal Echocardiogram °Transesophageal echocardiography (TEE) is a picture test of your heart using sound waves. The pictures taken can give very detailed pictures of your heart. This can help your doctor see if there are problems with your heart. TEE can check: °· If your heart has blood clots in it. °· How well your heart valves are working. °· If you have an infection on the inside of your heart. °· Some of the major arteries of your heart. °· If your heart valve is working after a repair. °· Your heart before a procedure that uses a shock to your heart to get the rhythm back to normal. °BEFORE THE PROCEDURE °· Do not eat or drink for 6 hours before the procedure or as told by your doctor. °· Make plans to have someone drive you home after the procedure. Do not drive yourself home. °· An IV tube will be put in your arm. °PROCEDURE °· You will be given a medicine to help you relax (sedative). It will be given through the IV tube. °· A numbing medicine will be sprayed or gargled in the back of your throat to help numb it. °· The tip of the probe is placed into the back of your mouth. You will be asked to swallow. This helps to pass the probe into your esophagus. °· Once the tip of the probe is in the right place, your doctor can take pictures of your heart. °· You may feel pressure at the back of your throat. °AFTER THE PROCEDURE °· You will be taken to a recovery area so the sedative can wear off. °· Your throat may be sore and scratchy. This will go away slowly over time. °· You will go home when you are fully awake and able to swallow liquids. °· You should have someone stay with you for the next 24 hours. °· Do not drive or operate machinery for the next 24 hours. °Document Released: 07/07/2009 Document Revised: 09/14/2013 Document Reviewed: 03/11/2013 °[employer]® Patient Information ©2015 [employer]. This information is not intended to replace advice given to you by your health care provider. Make  sure you discuss any questions you have with your health care provider. ° °

## 2015-05-26 NOTE — H&P (Addendum)
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 She has a history of HTN and HLD but has not had chest pain, palpitations, symptoms of TIA or stroke, or had prior cardiac testing. She has not had much improvement in her vision and says she basically blind in her OD.    Problem List: Past Medical History  Diagnosis Date  . Intrinsic asthma   . Cough   . Legionnaire's disease     1984  . GERD (gastroesophageal reflux disease)   . Hypothyroidism   . Depression   . Anxiety   . Hypertension   . Hypokalemia   . Diet-controlled diabetes mellitus   . Hyperlipidemia   . Central retinal artery occlusion, right eye     a. Dx 2016 - admitted with transient visual disturbance, had recently been dx with central retinal artery occlusion.    No past surgical history on file.   Allergies:  Allergies  Allergen Reactions  . Sulfa Antibiotics Hives  . Codeine Other (See Comments)    gi upset  . Penicillins Swelling  . Sulfonamide Derivatives Hives     Home Medications No current facility-administered medications for this encounter.   Prior to Admission medications   Medication Sig Start Date End Date Taking? Authorizing Provider  acetaminophen (TYLENOL) 325 MG tablet Take 650 mg by mouth every 6 (six) hours as needed.   Yes Historical Provider, MD  albuterol (PROAIR HFA) 108 (90 BASE) MCG/ACT inhaler Inhale 2 puffs into the lungs every 4 (four) hours as needed.   Yes Historical Provider, MD  ALPRAZolam (XANAX) 0.25 MG tablet Take 0.25 mg by mouth every 12 (twelve) hours as needed.   Yes Historical Provider, MD  aspirin EC 325 MG tablet Take 1 tablet (325 mg total)  by mouth daily. 04/28/15  Yes Orson Eva, MD  atorvastatin (LIPITOR) 20 MG tablet Take 1 tablet (20 mg total) by mouth daily at 6 PM. 04/28/15  Yes Orson Eva, MD  buPROPion (ZYBAN) 150 MG 12 hr tablet Take 450 mg by mouth daily.   Yes Historical Provider, MD  Calcium-Vitamin D (CALTRATE 600 PLUS-VIT D PO) Take 3 tablets by mouth daily.   Yes Historical Provider, MD  dexlansoprazole (DEXILANT) 60 MG capsule Take 60 mg by mouth daily.   Yes Historical Provider, MD  dextromethorphan (DELSYM) 30 MG/5ML liquid 2 tsp. Every 12 hrs. As needed   Yes Historical Provider, MD  dextromethorphan-guaiFENesin (MUCINEX DM) 30-600 MG per 12 hr tablet Take 1 tablet by mouth every 12 (twelve) hours.   Yes Historical Provider, MD  Eszopiclone (ESZOPICLONE) 3 MG TABS Take 3 mg by mouth at bedtime. Take immediately before bedtime   Yes Historical Provider, MD  FLUoxetine (PROZAC) 20 MG capsule Take 20 mg by mouth daily.   Yes Historical Provider, MD  levothyroxine (SYNTHROID, LEVOTHROID) 100 MCG tablet Take 100 mcg by mouth daily.   Yes Historical Provider, MD  losartan (COZAAR) 100 MG tablet Take 1 tablet (100 mg total) by mouth daily. 04/28/15  Yes Orson Eva, MD  Multiple Vitamins-Minerals (CENTRUM SILVER ULTRA WOMENS PO) Take 1 tablet by mouth daily.   Yes Historical Provider, MD  nebivolol (BYSTOLIC) 5 MG tablet  Take 1 tablet (5 mg total) by mouth daily. 04/28/15  Yes Orson Eva, MD  oxymetazoline (AFRIN) 0.05 % nasal spray Place 2 sprays into the nose 2 (two) times daily. Max 5 days only   Yes Historical Provider, MD  penciclovir (DENAVIR) 1 % cream Apply 1 application topically 2 (two) times daily as needed.   Yes Historical Provider, MD  potassium chloride SA (K-DUR,KLOR-CON) 20 MEQ tablet Take 60 mEq by mouth daily.   Yes Historical Provider, MD  Probiotic Product (ALIGN PO) Take 1 capsule by mouth daily.   Yes Historical Provider, MD  traMADol (ULTRAM) 50 MG tablet 1 -2 every 4 hrs. As needed   Yes Historical Provider, MD   triamcinolone (NASACORT) 55 MCG/ACT nasal inhaler Place 2 sprays into the nose 2 (two) times daily.   Yes Historical Provider, MD  scopolamine (TRANSDERM-SCOP) 1.5 MG Place 1 patch onto the skin daily as needed.    Historical Provider, MD     Family History  Problem Relation Age of Onset  . Asthma Sister   . Heart attack Father     died in his 74's of MI  . Stroke Father      Social History   Social History  . Marital Status: Married    Spouse Name: N/A  . Number of Children: N/A  . Years of Education: N/A   Occupational History  . Not on file.   Social History Main Topics  . Smoking status: Never Smoker   . Smokeless tobacco: Not on file  . Alcohol Use: No  . Drug Use: No  . Sexual Activity: Not on file   Other Topics Concern  . Not on file   Social History Narrative     Review of Systems: General: negative for chills, fever, night sweats or weight changes.  Cardiovascular: negative for chest pain, dyspnea on exertion, edema, orthopnea, palpitations, paroxysmal nocturnal dyspnea or shortness of breath HEENT: negative glaucoma Dermatological: negative for rash Respiratory: negative for cough, hemoptysis, or wheezing Urologic: negative for hematuria or dysuria Abdominal: negative for nausea, vomiting, diarrhea, bright red blood per rectum, melena, or hematemesis Neurologic: negative for syncope, or dizziness Musculoskeletal: negative for back pain, joint pain, or swelling Psych: cooperative and appropriate All other systems reviewed and are otherwise negative except as noted above.  Physical Exam: Blood pressure 176/98, pulse 76, temperature 97.7 F (36.5 C), temperature source Oral, resp. rate 16, SpO2 99 %.  General appearance: alert, cooperative, no distress and mildly obese Neck: no carotid bruit and no JVD Lungs: clear to auscultation bilaterally Heart: regular rate and rhythm and no murmur Abdomen: obese, non tender Extremities: extremities normal,  atraumatic, no cyanosis or edema Pulses: 2+ and symmetric Skin: Skin color, texture, turgor normal. No rashes or lesions Neurologic: Grossly normal    Labs:  No results found for this or any previous visit (from the past 24 hour(s)).   Radiology/Studies: Ct Angio Head W/cm &/or Wo Cm  04/28/2015   CLINICAL DATA:  Central retinal artery occlusion. Right eye blurry vision and headaches.  EXAM: CT ANGIOGRAPHY HEAD AND NECK  TECHNIQUE: Multidetector CT imaging of the head and neck was performed using the standard protocol during bolus administration of intravenous contrast. Multiplanar CT image reconstructions and MIPs were obtained to evaluate the vascular anatomy. Carotid stenosis measurements (when applicable) are obtained utilizing NASCET criteria, using the distal internal carotid diameter as the denominator.  CONTRAST:  83m OMNIPAQUE IOHEXOL 350 MG/ML SOLN  COMPARISON:  Brain MRI earlier  today  FINDINGS: CT HEAD  Brain: There is no evidence of acute cortical infarct, intracranial hemorrhage, mass, midline shift, or extra-axial fluid collection. Ventricles and sulci are normal for age. Patchy hypodensities in the subcortical and deep cerebral white matter are nonspecific but compatible with moderate chronic small vessel ischemic disease.  Calvarium and skull base: No skull fracture or destructive osseous lesion.  Paranasal sinuses: Clear.  Orbits: Unremarkable.  CTA NECK  Aortic arch: Incidental note is made of a common origin of the brachiocephalic and left common carotid arteries, a normal variant. Mild aortic arch atherosclerosis. Brachiocephalic and subclavian arteries are widely patent.  Right carotid system: Patent without evidence of stenosis, dissection, or aneurysm. Minimal irregularity of the distal cervical ICA.  Left carotid system: Patent without evidence of stenosis, dissection, or aneurysm. Very mild irregularity of the distal cervical ICA.  Vertebral arteries: Patent with the right  vertebral artery being extremely dominant. Left vertebral artery is markedly hypoplastic diffusely. No vertebral artery stenosis is seen.  Skeleton: Advanced degenerative changes at the right sternoclavicular joint. Multilevel cervical spondylosis greatest at C5-6.  Other neck: 1.7 cm heterogeneously attenuating right thyroid nodule with some calcification, similar in size to that reported on a 06/28/2002 ultrasound and followed by biopsy at that time.  CTA HEAD  Anterior circulation: Internal carotid arteries are patent from skullbase to carotid termini without stenosis. Ophthalmic artery origins are grossly patent. A1 and M1 segments are patent without stenosis. There is a moderate to severe focal right A2 stenosis. There is a proximal right M2 stenosis. No intracranial aneurysm is identified.  Posterior circulation: Dominant distal right vertebral artery without stenosis. PICA origins are patent. AICA and SCA origins are patent. Basilar artery is patent without stenosis. PCAs are unremarkable. Posterior communicating arteries are not identified.  Venous sinuses: Patent.  Anatomic variants: None.  Delayed phase: No abnormal enhancement.  IMPRESSION: 1. No major intracranial arterial occlusion. 2. 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 3. No cervical carotid artery stenosis. Dominant right vertebral artery.   Electronically Signed   By: Logan Bores   On: 04/28/2015 17:15   Dg Chest 2 View  04/28/2015   CLINICAL DATA:  TA, visual changes LEFT eye, slight headache, history hypertension  EXAM: CHEST  2 VIEW  COMPARISON:  08/09/2009  FINDINGS: Upper normal heart size.  Normal mediastinal contours and pulmonary vascularity.  Minimal chronic peribronchial thickening.  Lungs clear.  No pleural effusion or pneumothorax.  Bones unremarkable.  IMPRESSION: No acute abnormalities.   Electronically Signed   By: Lavonia Dana M.D.   On: 04/28/2015 09:01   Ct Angio Neck W/cm &/or Wo/cm  04/28/2015   CLINICAL DATA:   Central retinal artery occlusion. Right eye blurry vision and headaches.  EXAM: CT ANGIOGRAPHY HEAD AND NECK  TECHNIQUE: Multidetector CT imaging of the head and neck was performed using the standard protocol during bolus administration of intravenous contrast. Multiplanar CT image reconstructions and MIPs were obtained to evaluate the vascular anatomy. Carotid stenosis measurements (when applicable) are obtained utilizing NASCET criteria, using the distal internal carotid diameter as the denominator.  CONTRAST:  68m OMNIPAQUE IOHEXOL 350 MG/ML SOLN  COMPARISON:  Brain MRI earlier today  FINDINGS: CT HEAD  Brain: There is no evidence of acute cortical infarct, intracranial hemorrhage, mass, midline shift, or extra-axial fluid collection. Ventricles and sulci are normal for age. Patchy hypodensities in the subcortical and deep cerebral white matter are nonspecific but compatible with moderate chronic small vessel ischemic  disease.  Calvarium and skull base: No skull fracture or destructive osseous lesion.  Paranasal sinuses: Clear.  Orbits: Unremarkable.  CTA NECK  Aortic arch: Incidental note is made of a common origin of the brachiocephalic and left common carotid arteries, a normal variant. Mild aortic arch atherosclerosis. Brachiocephalic and subclavian arteries are widely patent.  Right carotid system: Patent without evidence of stenosis, dissection, or aneurysm. Minimal irregularity of the distal cervical ICA.  Left carotid system: Patent without evidence of stenosis, dissection, or aneurysm. Very mild irregularity of the distal cervical ICA.  Vertebral arteries: Patent with the right vertebral artery being extremely dominant. Left vertebral artery is markedly hypoplastic diffusely. No vertebral artery stenosis is seen.  Skeleton: Advanced degenerative changes at the right sternoclavicular joint. Multilevel cervical spondylosis greatest at C5-6.  Other neck: 1.7 cm heterogeneously attenuating right thyroid  nodule with some calcification, similar in size to that reported on a 06/28/2002 ultrasound and followed by biopsy at that time.  CTA HEAD  Anterior circulation: Internal carotid arteries are patent from skullbase to carotid termini without stenosis. Ophthalmic artery origins are grossly patent. A1 and M1 segments are patent without stenosis. There is a moderate to severe focal right A2 stenosis. There is a proximal right M2 stenosis. No intracranial aneurysm is identified.  Posterior circulation: Dominant distal right vertebral artery without stenosis. PICA origins are patent. AICA and SCA origins are patent. Basilar artery is patent without stenosis. PCAs are unremarkable. Posterior communicating arteries are not identified.  Venous sinuses: Patent.  Anatomic variants: None.  Delayed phase: No abnormal enhancement.  IMPRESSION: 1. No major intracranial arterial occlusion. 2. 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 3. No cervical carotid artery stenosis. Dominant right vertebral artery.   Electronically Signed   By: Logan Bores   On: 04/28/2015 17:15   Mr Brain Wo Contrast  04/28/2015   CLINICAL DATA:  Possible TIA. Acute onset right eye visual disturbance 04/26/2015 with symptoms now improved.  EXAM: MRI HEAD WITHOUT CONTRAST  TECHNIQUE: Multiplanar, multiecho pulse sequences of the brain and surrounding structures were obtained without intravenous contrast.  COMPARISON:  Head CT 08/10/2010  FINDINGS: There is no evidence of acute infarct, mass, midline shift, or extra-axial fluid collection. Patchy and confluent T2 hyperintensities in the subcortical and deep cerebral white matter and pons are nonspecific but compatible with moderate chronic small vessel ischemic disease. Foci of chronic microhemorrhage are noted in the superior left cerebellum, lateral right thalamus/ internal capsule, and medial high right frontal lobe. Ventricles and sulci are within normal limits for age.  Orbits are  unremarkable. Paranasal sinuses are clear. There is trace left mastoid fluid. Distal left vertebral artery appears markedly hypoplastic. Other major intracranial vascular flow voids are preserved.  IMPRESSION: 1. No acute intracranial abnormality. 2. Moderate chronic small vessel ischemic disease.   Electronically Signed   By: Logan Bores   On: 04/28/2015 11:02    EKG: NSR  ASSESSMENT AND PLAN:  Active Problems:   Central retinal artery occlusion of right eye   HLD (hyperlipidemia)   Intrinsic asthma   Hypothyroidism   GERD (gastroesophageal reflux disease)   Depression   Family history of coronary artery disease in father   PLAN: Continue ASA, statin, Cozaar and other home medications.  TEE today  Kerin Ransom PA-C 05/26/2015 9:37 AM      [facility] has been requested to perform a transesophageal echocardiogram on Vickie Chang for retinal artery occlusion.  After careful review of history  and examination, the risks and benefits of transesophageal echocardiogram have been explained including risks of esophageal damage, perforation (1:10,000 risk), bleeding, pharyngeal hematoma as well as other potential complications associated with conscious sedation including aspiration, arrhythmia, respiratory failure and death. Alternatives to treatment were discussed, questions were answered. Patient is willing to proceed.   Erlene Quan, PA 05/26/2015 9:37 AM  I have seen and examined the patient along with Erlene Quan, PA.  I have reviewed the chart, notes and new data.  I agree with PA's note.  PLAN: This procedure has been fully reviewed with the patient and written informed consent has been obtained.   Sanda Klein, MD, [place] [phone] 05/26/2015, 9:39 AM  Check BMP this am as last K+ was 3.0 on 8/4.  Kerin Ransom PA-C 05/26/2015 9:43 AM

## 2015-05-26 NOTE — CV Procedure (Signed)
Procedure: TEE  Indication: Central retinal artery occlusion, ?cardioembolic  Sedation: Versed 6 mg IV, Fentanyl 75 mcg IV  Please see echo section for full report.  Normal LV size with mild LV hypertrophy.  EF 55-60%.  Mild LAE, mild RAE.  No LAA thrombus.  Normal RV size and systolic function.  Trivial MR.  Trileaflet aortic valve with no AS, trivial AI. Trivial TR.  There was a small pericardial effusion predominantly along the RV free wall.  No ASD or PFO, negative bubble study.  The aorta was normal in caliber with mild plaque in the descending thoracic aorta.   No source for embolism noted.   Loralie Champagne 05/26/2015 10:48 AM

## 2015-05-26 NOTE — Progress Notes (Signed)
  Echocardiogram Echocardiogram Transesophageal has been performed.  Jennette Dubin 05/26/2015, 11:07 AM

## 2015-05-28 ENCOUNTER — Encounter (HOSPITAL_COMMUNITY): Payer: Self-pay | Admitting: Cardiology

## 2015-06-02 ENCOUNTER — Emergency Department (HOSPITAL_BASED_OUTPATIENT_CLINIC_OR_DEPARTMENT_OTHER): Payer: Medicare Other

## 2015-06-02 ENCOUNTER — Emergency Department (HOSPITAL_BASED_OUTPATIENT_CLINIC_OR_DEPARTMENT_OTHER)
Admission: EM | Admit: 2015-06-02 | Discharge: 2015-06-03 | Disposition: A | Discharge status: Other Acute Inpatient Hospital | Payer: Medicare Other | Attending: Emergency Medicine | Admitting: Emergency Medicine

## 2015-06-02 ENCOUNTER — Encounter (HOSPITAL_BASED_OUTPATIENT_CLINIC_OR_DEPARTMENT_OTHER): Payer: Self-pay | Admitting: *Deleted

## 2015-06-02 DIAGNOSIS — W1789XA Other fall from one level to another, initial encounter: Secondary | ICD-10-CM | POA: Insufficient documentation

## 2015-06-02 DIAGNOSIS — D4959 Neoplasm of unspecified behavior of other genitourinary organ: Secondary | ICD-10-CM

## 2015-06-02 DIAGNOSIS — Y9389 Activity, other specified: Secondary | ICD-10-CM | POA: Diagnosis not present

## 2015-06-02 DIAGNOSIS — Y998 Other external cause status: Secondary | ICD-10-CM | POA: Insufficient documentation

## 2015-06-02 DIAGNOSIS — F329 Major depressive disorder, single episode, unspecified: Secondary | ICD-10-CM | POA: Insufficient documentation

## 2015-06-02 DIAGNOSIS — Z7951 Long term (current) use of inhaled steroids: Secondary | ICD-10-CM | POA: Diagnosis not present

## 2015-06-02 DIAGNOSIS — S40012A Contusion of left shoulder, initial encounter: Secondary | ICD-10-CM | POA: Insufficient documentation

## 2015-06-02 DIAGNOSIS — Z880 Allergy status to penicillin: Secondary | ICD-10-CM | POA: Insufficient documentation

## 2015-06-02 DIAGNOSIS — E039 Hypothyroidism, unspecified: Secondary | ICD-10-CM | POA: Diagnosis not present

## 2015-06-02 DIAGNOSIS — F419 Anxiety disorder, unspecified: Secondary | ICD-10-CM | POA: Insufficient documentation

## 2015-06-02 DIAGNOSIS — E876 Hypokalemia: Secondary | ICD-10-CM | POA: Insufficient documentation

## 2015-06-02 DIAGNOSIS — J45909 Unspecified asthma, uncomplicated: Secondary | ICD-10-CM | POA: Diagnosis not present

## 2015-06-02 DIAGNOSIS — S0291XA Unspecified fracture of skull, initial encounter for closed fracture: Secondary | ICD-10-CM | POA: Insufficient documentation

## 2015-06-02 DIAGNOSIS — Z8669 Personal history of other diseases of the nervous system and sense organs: Secondary | ICD-10-CM | POA: Diagnosis not present

## 2015-06-02 DIAGNOSIS — S199XXA Unspecified injury of neck, initial encounter: Secondary | ICD-10-CM | POA: Insufficient documentation

## 2015-06-02 DIAGNOSIS — Z8619 Personal history of other infectious and parasitic diseases: Secondary | ICD-10-CM | POA: Insufficient documentation

## 2015-06-02 DIAGNOSIS — E119 Type 2 diabetes mellitus without complications: Secondary | ICD-10-CM | POA: Diagnosis not present

## 2015-06-02 DIAGNOSIS — R58 Hemorrhage, not elsewhere classified: Secondary | ICD-10-CM

## 2015-06-02 DIAGNOSIS — Z79899 Other long term (current) drug therapy: Secondary | ICD-10-CM | POA: Diagnosis not present

## 2015-06-02 DIAGNOSIS — K5649 Other impaction of intestine: Secondary | ICD-10-CM | POA: Diagnosis not present

## 2015-06-02 DIAGNOSIS — Y9289 Other specified places as the place of occurrence of the external cause: Secondary | ICD-10-CM | POA: Insufficient documentation

## 2015-06-02 DIAGNOSIS — S0003XA Contusion of scalp, initial encounter: Secondary | ICD-10-CM | POA: Diagnosis not present

## 2015-06-02 DIAGNOSIS — K219 Gastro-esophageal reflux disease without esophagitis: Secondary | ICD-10-CM | POA: Insufficient documentation

## 2015-06-02 DIAGNOSIS — Z7982 Long term (current) use of aspirin: Secondary | ICD-10-CM | POA: Diagnosis not present

## 2015-06-02 DIAGNOSIS — C569 Malignant neoplasm of unspecified ovary: Secondary | ICD-10-CM | POA: Insufficient documentation

## 2015-06-02 DIAGNOSIS — S0990XA Unspecified injury of head, initial encounter: Secondary | ICD-10-CM | POA: Diagnosis present

## 2015-06-02 DIAGNOSIS — E041 Nontoxic single thyroid nodule: Secondary | ICD-10-CM | POA: Diagnosis not present

## 2015-06-02 DIAGNOSIS — E785 Hyperlipidemia, unspecified: Secondary | ICD-10-CM | POA: Insufficient documentation

## 2015-06-02 DIAGNOSIS — I10 Essential (primary) hypertension: Secondary | ICD-10-CM | POA: Insufficient documentation

## 2015-06-02 LAB — CBC WITH DIFFERENTIAL/PLATELET
BASOS ABS: 0.1 10*3/uL (ref 0.0–0.1)
Basophils Relative: 0 % (ref 0–1)
Eosinophils Absolute: 0.4 10*3/uL (ref 0.0–0.7)
Eosinophils Relative: 3 % (ref 0–5)
HEMATOCRIT: 38 % (ref 36.0–46.0)
Hemoglobin: 13.3 g/dL (ref 12.0–15.0)
LYMPHS PCT: 9 % — AB (ref 12–46)
Lymphs Abs: 1.3 10*3/uL (ref 0.7–4.0)
MCH: 29.6 pg (ref 26.0–34.0)
MCHC: 35 g/dL (ref 30.0–36.0)
MCV: 84.4 fL (ref 78.0–100.0)
MONO ABS: 1 10*3/uL (ref 0.1–1.0)
Monocytes Relative: 8 % (ref 3–12)
NEUTROS ABS: 11 10*3/uL — AB (ref 1.7–7.7)
Neutrophils Relative %: 80 % — ABNORMAL HIGH (ref 43–77)
Platelets: 280 10*3/uL (ref 150–400)
RBC: 4.5 MIL/uL (ref 3.87–5.11)
RDW: 13.3 % (ref 11.5–15.5)
WBC: 13.7 10*3/uL — AB (ref 4.0–10.5)

## 2015-06-02 MED ORDER — FENTANYL CITRATE (PF) 100 MCG/2ML IJ SOLN
50.0000 ug | Freq: Once | INTRAMUSCULAR | Status: AC
  Administered 2015-06-02: 50 ug via INTRAVENOUS
  Filled 2015-06-02: qty 2

## 2015-06-02 NOTE — ED Notes (Signed)
Pt states her respirations are labored and she is much worse than on arrival. Pt is speaking in complete sentences. no respiratory difficulty. VS remain stable and unchanged.

## 2015-06-02 NOTE — ED Provider Notes (Addendum)
CSN: [id number]     Arrival date & time 06/02/15  2033 History  This chart was scribed for Jamisyn Langer, MD by Rayna Sexton, ED scribe. This patient was seen in room MH09/MH09 and the patient's care was started at 11:31 PM.   Chief Complaint  Patient presents with  . Fall   Patient is a 69 y.o. female presenting with fall. The history is provided by the patient. No language interpreter was used.  Fall This is a new problem. The current episode started 2 days ago. The problem occurs rarely. The problem has not changed since onset.Associated symptoms include abdominal pain. Pertinent negatives include no chest pain and no headaches. The symptoms are aggravated by bending and walking. The symptoms are relieved by narcotics. Treatments tried: hydrocodone. The treatment provided mild relief.    HPI Comments: MALAKA RUFFNER is a 69 y.o. female who presents to the Emergency Department complaining of a fall that occurred 2 days ago. Pt notes missing a step on her front porch and falling. She notes associated pain to her posterior head, bilateral ribs and  LUQ. She also notes bilateral foot swelling. Pt's relative notes that she took 2x hydrocodone s/p the fall which provided some mild relief of her pain. Pt notes a hx of stroke on 8/1.   Past Medical History  Diagnosis Date  . Intrinsic asthma   . Cough   . Legionnaire's disease     1984  . GERD (gastroesophageal reflux disease)   . Hypothyroidism   . Depression   . Anxiety   . Hypertension   . Hypokalemia   . Diet-controlled diabetes mellitus   . Hyperlipidemia   . Central retinal artery occlusion, right eye     a. Dx 2016 - admitted with transient visual disturbance, had recently been dx with central retinal artery occlusion.   Past Surgical History  Procedure Laterality Date  . Tee without cardioversion N/A 05/26/2015    Procedure: TRANSESOPHAGEAL ECHOCARDIOGRAM (TEE);  Surgeon: Larey Dresser, MD;  Location: [facility] ENDOSCOPY;  Service:  Cardiovascular;  Laterality: N/A;   Family History  Problem Relation Age of Onset  . Asthma Sister   . Heart attack Father     died in his 50's of MI  . Stroke Father    Social History  Substance Use Topics  . Smoking status: Never Smoker   . Smokeless tobacco: None  . Alcohol Use: No   OB History    No data available     Review of Systems  Eyes: Negative for photophobia.  Cardiovascular: Positive for leg swelling. Negative for chest pain.  Gastrointestinal: Positive for vomiting, abdominal pain and abdominal distention.  Musculoskeletal: Positive for neck pain.  Neurological: Negative for dizziness, seizures, facial asymmetry, speech difficulty, numbness and headaches.  All other systems reviewed and are negative.  Allergies  Sulfa antibiotics; Codeine; Penicillins; and Sulfonamide derivatives  Home Medications   Prior to Admission medications   Medication Sig Start Date End Date Taking? Authorizing Provider  acetaminophen (TYLENOL) 325 MG tablet Take 650 mg by mouth every 6 (six) hours as needed.    Historical Provider, MD  albuterol (PROAIR HFA) 108 (90 BASE) MCG/ACT inhaler Inhale 2 puffs into the lungs every 4 (four) hours as needed.    Historical Provider, MD  ALPRAZolam Duanne Moron) 0.25 MG tablet Take 0.25 mg by mouth every 12 (twelve) hours as needed.    Historical Provider, MD  aspirin EC 325 MG tablet Take 1 tablet (325 mg total)  by mouth daily. 04/28/15   Orson Eva, MD  atorvastatin (LIPITOR) 20 MG tablet Take 1 tablet (20 mg total) by mouth daily at 6 PM. 04/28/15   Orson Eva, MD  buPROPion (ZYBAN) 150 MG 12 hr tablet Take 450 mg by mouth daily.    Historical Provider, MD  Calcium-Vitamin D (CALTRATE 600 PLUS-VIT D PO) Take 3 tablets by mouth daily.    Historical Provider, MD  dexlansoprazole (DEXILANT) 60 MG capsule Take 60 mg by mouth daily.    Historical Provider, MD  dextromethorphan (DELSYM) 30 MG/5ML liquid 2 tsp. Every 12 hrs. As needed    Historical Provider, MD   dextromethorphan-guaiFENesin [facility] DM) 30-600 MG per 12 hr tablet Take 1 tablet by mouth every 12 (twelve) hours.    Historical Provider, MD  Eszopiclone (ESZOPICLONE) 3 MG TABS Take 3 mg by mouth at bedtime. Take immediately before bedtime    Historical Provider, MD  FLUoxetine (PROZAC) 20 MG capsule Take 20 mg by mouth daily.    Historical Provider, MD  levothyroxine (SYNTHROID, LEVOTHROID) 100 MCG tablet Take 100 mcg by mouth daily.    Historical Provider, MD  losartan (COZAAR) 100 MG tablet Take 1 tablet (100 mg total) by mouth daily. 04/28/15   Orson Eva, MD  Multiple Vitamins-Minerals (CENTRUM SILVER ULTRA WOMENS PO) Take 1 tablet by mouth daily.    Historical Provider, MD  nebivolol (BYSTOLIC) 5 MG tablet Take 1 tablet (5 mg total) by mouth daily. 04/28/15   Orson Eva, MD  oxymetazoline (AFRIN) 0.05 % nasal spray Place 2 sprays into the nose 2 (two) times daily. Max 5 days only    Historical Provider, MD  penciclovir (DENAVIR) 1 % cream Apply 1 application topically 2 (two) times daily as needed.    Historical Provider, MD  potassium chloride SA (K-DUR,KLOR-CON) 20 MEQ tablet Take 60 mEq by mouth daily.    Historical Provider, MD  Probiotic Product (ALIGN PO) Take 1 capsule by mouth daily.    Historical Provider, MD  scopolamine (TRANSDERM-SCOP) 1.5 MG Place 1 patch onto the skin daily as needed.    Historical Provider, MD  traMADol (ULTRAM) 50 MG tablet 1 -2 every 4 hrs. As needed    Historical Provider, MD  triamcinolone (NASACORT) 55 MCG/ACT nasal inhaler Place 2 sprays into the nose 2 (two) times daily.    Historical Provider, MD   BP 159/73 mmHg  Pulse 75  Temp(Src) 98.3 F (36.8 C) (Oral)  Resp 20  Ht 5' (1.524 m)  Wt 162 lb (73.483 kg)  BMI 31.64 kg/m2  SpO2 100% Physical Exam  Constitutional: She is oriented to person, place, and time. She appears well-developed.  HENT:  Head: Normocephalic and atraumatic. Head is without raccoon's eyes and without Battle's sign.  Right  Ear: No mastoid tenderness. No hemotympanum.  Left Ear: No mastoid tenderness. No hemotympanum.  Mouth/Throat: Oropharynx is clear and moist. No oropharyngeal exudate.  No hemo temp bilaterally  Eyes: EOM are normal. Pupils are equal, round, and reactive to light.  Neck: Normal range of motion. Neck supple. No thyromegaly present.    Cardiovascular: Normal rate, regular rhythm, normal heart sounds and intact distal pulses.   Pulmonary/Chest: Effort normal and breath sounds normal. No respiratory distress. She has no wheezes. She has no rales.  Abdominal: Soft. She exhibits distension. She exhibits no ascites. Bowel sounds are decreased. There is no rebound and no guarding.  Musculoskeletal: Normal range of motion. She exhibits no edema or tenderness.  ecchymosis over  left trapezius; no step offs or crepitus; hematoma over left trapezius muscle; compartments soft bilaterally  Neurological: She is alert and oriented to person, place, and time. She has normal reflexes.  Skin: Skin is warm and dry.  Psychiatric: She has a normal mood and affect.  Nursing note and vitals reviewed.  ED Course  Procedures  DIAGNOSTIC STUDIES: Oxygen Saturation is 100% on RA, normal by my interpretation.    COORDINATION OF CARE: 11:36 PM Discussed treatment plan with pt at bedside and pt agreed to plan.  Labs Review Labs Reviewed - No data to display  Imaging Review No results found. I have personally reviewed and evaluated these images and lab results as part of my medical decision-making.   EKG Interpretation None      MDM   Final diagnoses:  None    Results for orders placed or performed during the hospital encounter of 06/02/15  CBC with Differential/Platelet  Result Value Ref Range   WBC 13.7 (H) 4.0 - 10.5 K/uL   RBC 4.50 3.87 - 5.11 MIL/uL   Hemoglobin 13.3 12.0 - 15.0 g/dL   HCT 38.0 36.0 - 46.0 %   MCV 84.4 78.0 - 100.0 fL   MCH 29.6 26.0 - 34.0 pg   MCHC 35.0 30.0 - 36.0 g/dL    RDW 13.3 11.5 - 15.5 %   Platelets 280 150 - 400 K/uL   Neutrophils Relative % 80 (H) 43 - 77 %   Neutro Abs 11.0 (H) 1.7 - 7.7 K/uL   Lymphocytes Relative 9 (L) 12 - 46 %   Lymphs Abs 1.3 0.7 - 4.0 K/uL   Monocytes Relative 8 3 - 12 %   Monocytes Absolute 1.0 0.1 - 1.0 K/uL   Eosinophils Relative 3 0 - 5 %   Eosinophils Absolute 0.4 0.0 - 0.7 K/uL   Basophils Relative 0 0 - 1 %   Basophils Absolute 0.1 0.0 - 0.1 K/uL  Basic metabolic panel  Result Value Ref Range   Sodium 127 (L) 135 - 145 mmol/L   Potassium 3.4 (L) 3.5 - 5.1 mmol/L   Chloride 88 (L) 101 - 111 mmol/L   CO2 29 22 - 32 mmol/L   Glucose, Bld 128 (H) 65 - 99 mg/dL   BUN 10 6 - 20 mg/dL   Creatinine, Ser 0.75 0.44 - 1.00 mg/dL   Calcium 8.9 8.9 - 10.3 mg/dL   GFR calc non Af Amer >60 >60 mL/min   GFR calc Af Amer >60 >60 mL/min   Anion gap 10 5 - 15   Ct Chest W Contrast  06/03/2015   CLINICAL DATA:  Fall 2 days ago. Pain to bilateral ribs and upper back. Chest tightness and bilateral foot swelling.  EXAM: CT CHEST, ABDOMEN, AND PELVIS WITH CONTRAST  TECHNIQUE: Multidetector CT imaging of the chest, abdomen and pelvis was performed following the standard protocol during bolus administration of intravenous contrast.  CONTRAST:  165mL OMNIPAQUE IOHEXOL 300 MG/ML  SOLN  COMPARISON:  None.  FINDINGS: CT CHEST FINDINGS  Normal heart size. Normal caliber thoracic aorta. No evidence of aortic dissection, allowing for motion artifact. Great vessel origins are patent. Sub cm nodule in the right thyroid gland. Small esophageal hiatal hernia. Esophagus is mostly decompressed. No significant lymphadenopathy in the chest.  Atelectasis in the lung bases. No focal airspace disease. No pneumothorax. No pleural effusions. Airways appear patent.  CT ABDOMEN AND PELVIS FINDINGS  Mild diffuse fatty infiltration in the liver. Gallbladder is mildly distended  without wall thickening or stone identified. The pancreas, spleen, adrenal glands, kidneys,  abdominal aorta, inferior vena cava, and retroperitoneal lymph nodes are unremarkable. Stomach wall is not thickened. Small bowel are decompressed. Stool-filled colon without abnormal distention. No free air or free fluid in the abdomen.  Pelvis: There is a huge cyst arising out of the pelvis and extending up into the abdomen with a single large component and small adjacent septated cystic components with mild mural nodularity. The mass measures 22.7 x 18.9 x 32.2 cm. A portion of the lesion appears to extend into an umbilical hernia. 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 Surgical consultation is suggested. Uterus is not enlarged. Bladder wall is not thickened. No free or loculated pelvic fluid collections.  Bones: Normal alignment of the thoracic and lumbar spine. Superior endplate compression fractures at T10 and T12 possibly representing Schmorl's nodes. These appear chronic. Mild degenerative changes in the lower lumbar spine. Sternum, visualized shoulders and clavicles, and ribs appear intact. Sacrum, pelvis, and hips appear intact.  IMPRESSION: No acute posttraumatic changes demonstrated in the chest, abdomen, or pelvis. Mild endplate compression deformities at T10 and T12 appear old. There is a very large complex cystic lesion arising out of the pelvis and extending up into the abdomen. 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 Surgical consultation is suggested.   Electronically Signed   By: Lucienne Capers M.D.   On: 06/03/2015 01:15   Ct Cervical Spine Wo Contrast  06/03/2015   CLINICAL DATA:  69 year old female with fall  EXAM: CT CERVICAL SPINE WITHOUT CONTRAST  TECHNIQUE: Multidetector CT imaging of the cervical spine was performed without intravenous contrast. Multiplanar CT image reconstructions were also generated.  COMPARISON:  CT dated 04/28/2015  FINDINGS: There is no acute fracture or subluxation of the cervical spine.Multilevel degenerative changes most  prominent at C5-C6 for there is endplate irregularity and disc space narrowing.The odontoid and spinous processes are intact.There is normal anatomic alignment of the C1-C2 lateral masses. The visualized soft tissues appear unremarkable.  A 1.7 cm right thyroid nodule noted. Ultrasound may provide better evaluation of the thyroid gland.  IMPRESSION: No acute/ traumatic cervical spine pathology.   Electronically Signed   By: Anner Crete M.D.   On: 06/03/2015 01:59   Ct Abdomen Pelvis W Contrast  06/03/2015   CLINICAL DATA:  Fall 2 days ago. Pain to bilateral ribs and upper back. Chest tightness and bilateral foot swelling.  EXAM: CT CHEST, ABDOMEN, AND PELVIS WITH CONTRAST  TECHNIQUE: Multidetector CT imaging of the chest, abdomen and pelvis was performed following the standard protocol during bolus administration of intravenous contrast.  CONTRAST:  116mL OMNIPAQUE IOHEXOL 300 MG/ML  SOLN  COMPARISON:  None.  FINDINGS: CT CHEST FINDINGS  Normal heart size. Normal caliber thoracic aorta. No evidence of aortic dissection, allowing for motion artifact. Great vessel origins are patent. Sub cm nodule in the right thyroid gland. Small esophageal hiatal hernia. Esophagus is mostly decompressed. No significant lymphadenopathy in the chest.  Atelectasis in the lung bases. No focal airspace disease. No pneumothorax. No pleural effusions. Airways appear patent.  CT ABDOMEN AND PELVIS FINDINGS  Mild diffuse fatty infiltration in the liver. Gallbladder is mildly distended without wall thickening or stone identified. The pancreas, spleen, adrenal glands, kidneys, abdominal aorta, inferior vena cava, and retroperitoneal lymph nodes are unremarkable. Stomach wall is not thickened. Small bowel are decompressed. Stool-filled colon without abnormal distention. No free air or free fluid in the abdomen.  Pelvis: There is a huge cyst arising out of the pelvis and extending up into the abdomen with a single large component and  small adjacent septated cystic components with mild mural nodularity. The mass measures 22.7 x 18.9 x 32.2 cm. A portion of the lesion appears to extend into an umbilical hernia. 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 Surgical consultation is suggested. Uterus is not enlarged. Bladder wall is not thickened. No free or loculated pelvic fluid collections.  Bones: Normal alignment of the thoracic and lumbar spine. Superior endplate compression fractures at T10 and T12 possibly representing Schmorl's nodes. These appear chronic. Mild degenerative changes in the lower lumbar spine. Sternum, visualized shoulders and clavicles, and ribs appear intact. Sacrum, pelvis, and hips appear intact.  IMPRESSION: No acute posttraumatic changes demonstrated in the chest, abdomen, or pelvis. Mild endplate compression deformities at T10 and T12 appear old. There is a very large complex cystic lesion arising out of the pelvis and extending up into the abdomen. 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 Surgical consultation is suggested.   Electronically Signed   By: Lucienne Capers M.D.   On: 06/03/2015 01:15   Dg Chest Port 1 View  06/03/2015   CLINICAL DATA:  69 year old female with fall  EXAM: PORTABLE CHEST - 1 VIEW  COMPARISON:  None.  FINDINGS: The heart size and mediastinal contours are within normal limits. Both lungs are clear. The visualized skeletal structures are unremarkable.  IMPRESSION: No active disease.   Electronically Signed   By: Anner Crete M.D.   On: 06/03/2015 02:05   Medications  0.9 %  sodium chloride infusion ( Intravenous New Bag/Given 06/03/15 0408)  fentaNYL (SUBLIMAZE) injection 50 mcg (50 mcg Intravenous Given 06/02/15 2350)  iohexol (OMNIPAQUE) 300 MG/ML solution 100 mL (100 mLs Intravenous Contrast Given 06/03/15 0032)  fentaNYL (SUBLIMAZE) injection 50 mcg (50 mcg Intravenous Given 06/03/15 0155)  fentaNYL (SUBLIMAZE) injection 50 mcg (50 mcg Intravenous  Given 06/03/15 0336)  ondansetron (ZOFRAN) injection 4 mg (4 mg Intravenous Given 06/03/15 0406)    Case d/w Dr. Elonda Husky of gynecology at [facility] hospital.  [facility].  Please consult GYN oncology  At [facility].    320 case d/w Dr. Claiborne Billings GYN onc at [facility], no beds or capacity to accept transfer at this time    348 Case d/w Dr. Carlis Abbott of GYN onc at [facility], will contact attending.  Please make patient NPO   355 case d/w Dr. Danne Baxter of GYN oncology at [facility] who will acept patient to med surgery bed.  Please place NGT   I personally performed the services described in this documentation, which was scribed in my presence. The recorded information has been reviewed and is accurate.   532 am case d/w Dr. Ronnald Ramp of neurosurgery at [facility] who reviewed the patient's CT of the head scan and the fracture requires no intervention and there is no trauma to the brain.  Cleared by neurosurgery   Leler Brion, MD 06/03/15 0422  Adelia Baptista, MD 06/03/15 [id number]

## 2015-06-02 NOTE — ED Notes (Signed)
Fell 3 feet off a porch 2 days ago. She hit the back of her head. No LOC. She was trying to take Vicodin but it is making her nauseated.

## 2015-06-03 ENCOUNTER — Encounter (HOSPITAL_BASED_OUTPATIENT_CLINIC_OR_DEPARTMENT_OTHER): Payer: Self-pay | Admitting: Emergency Medicine

## 2015-06-03 ENCOUNTER — Emergency Department (HOSPITAL_BASED_OUTPATIENT_CLINIC_OR_DEPARTMENT_OTHER): Payer: Medicare Other

## 2015-06-03 DIAGNOSIS — F419 Anxiety disorder, unspecified: Secondary | ICD-10-CM | POA: Diagnosis not present

## 2015-06-03 DIAGNOSIS — K219 Gastro-esophageal reflux disease without esophagitis: Secondary | ICD-10-CM | POA: Diagnosis not present

## 2015-06-03 DIAGNOSIS — Z7982 Long term (current) use of aspirin: Secondary | ICD-10-CM | POA: Diagnosis not present

## 2015-06-03 DIAGNOSIS — S0003XA Contusion of scalp, initial encounter: Secondary | ICD-10-CM | POA: Diagnosis not present

## 2015-06-03 DIAGNOSIS — C569 Malignant neoplasm of unspecified ovary: Secondary | ICD-10-CM | POA: Diagnosis not present

## 2015-06-03 DIAGNOSIS — Y998 Other external cause status: Secondary | ICD-10-CM | POA: Diagnosis not present

## 2015-06-03 DIAGNOSIS — S0990XA Unspecified injury of head, initial encounter: Secondary | ICD-10-CM | POA: Diagnosis present

## 2015-06-03 DIAGNOSIS — E041 Nontoxic single thyroid nodule: Secondary | ICD-10-CM | POA: Diagnosis not present

## 2015-06-03 DIAGNOSIS — Y9289 Other specified places as the place of occurrence of the external cause: Secondary | ICD-10-CM | POA: Diagnosis not present

## 2015-06-03 DIAGNOSIS — Z7951 Long term (current) use of inhaled steroids: Secondary | ICD-10-CM | POA: Diagnosis not present

## 2015-06-03 DIAGNOSIS — E039 Hypothyroidism, unspecified: Secondary | ICD-10-CM | POA: Diagnosis not present

## 2015-06-03 DIAGNOSIS — E785 Hyperlipidemia, unspecified: Secondary | ICD-10-CM | POA: Diagnosis not present

## 2015-06-03 DIAGNOSIS — I10 Essential (primary) hypertension: Secondary | ICD-10-CM | POA: Diagnosis not present

## 2015-06-03 DIAGNOSIS — J45909 Unspecified asthma, uncomplicated: Secondary | ICD-10-CM | POA: Diagnosis not present

## 2015-06-03 DIAGNOSIS — E876 Hypokalemia: Secondary | ICD-10-CM | POA: Diagnosis not present

## 2015-06-03 DIAGNOSIS — Z880 Allergy status to penicillin: Secondary | ICD-10-CM | POA: Diagnosis not present

## 2015-06-03 DIAGNOSIS — S40012A Contusion of left shoulder, initial encounter: Secondary | ICD-10-CM | POA: Diagnosis not present

## 2015-06-03 DIAGNOSIS — K5649 Other impaction of intestine: Secondary | ICD-10-CM | POA: Diagnosis not present

## 2015-06-03 DIAGNOSIS — Y9389 Activity, other specified: Secondary | ICD-10-CM | POA: Diagnosis not present

## 2015-06-03 DIAGNOSIS — Z8619 Personal history of other infectious and parasitic diseases: Secondary | ICD-10-CM | POA: Diagnosis not present

## 2015-06-03 DIAGNOSIS — F329 Major depressive disorder, single episode, unspecified: Secondary | ICD-10-CM | POA: Diagnosis not present

## 2015-06-03 DIAGNOSIS — S199XXA Unspecified injury of neck, initial encounter: Secondary | ICD-10-CM | POA: Diagnosis not present

## 2015-06-03 DIAGNOSIS — W1789XA Other fall from one level to another, initial encounter: Secondary | ICD-10-CM | POA: Diagnosis not present

## 2015-06-03 DIAGNOSIS — E119 Type 2 diabetes mellitus without complications: Secondary | ICD-10-CM | POA: Diagnosis not present

## 2015-06-03 DIAGNOSIS — Z8669 Personal history of other diseases of the nervous system and sense organs: Secondary | ICD-10-CM | POA: Diagnosis not present

## 2015-06-03 DIAGNOSIS — Z79899 Other long term (current) drug therapy: Secondary | ICD-10-CM | POA: Diagnosis not present

## 2015-06-03 DIAGNOSIS — S0291XA Unspecified fracture of skull, initial encounter for closed fracture: Secondary | ICD-10-CM | POA: Diagnosis not present

## 2015-06-03 LAB — BASIC METABOLIC PANEL
Anion gap: 10 (ref 5–15)
BUN: 10 mg/dL (ref 6–20)
CO2: 29 mmol/L (ref 22–32)
CREATININE: 0.75 mg/dL (ref 0.44–1.00)
Calcium: 8.9 mg/dL (ref 8.9–10.3)
Chloride: 88 mmol/L — ABNORMAL LOW (ref 101–111)
GFR calc Af Amer: >60 mL/min (ref >60)
GFR calc non Af Amer: >60 mL/min (ref >60)
GLUCOSE: 128 mg/dL — AB (ref 65–99)
Potassium: 3.4 mmol/L — ABNORMAL LOW (ref 3.5–5.1)
Sodium: 127 mmol/L — ABNORMAL LOW (ref 135–145)

## 2015-06-03 MED ORDER — SODIUM CHLORIDE 0.9 % IV SOLN
INTRAVENOUS | Status: DC
  Administered 2015-06-03: 04:00:00 via INTRAVENOUS

## 2015-06-03 MED ORDER — LIDOCAINE HCL 2 % EX GEL
CUTANEOUS | Status: AC
  Administered 2015-06-03: 20
  Filled 2015-06-03: qty 20

## 2015-06-03 MED ORDER — FENTANYL CITRATE (PF) 100 MCG/2ML IJ SOLN
50.0000 ug | Freq: Once | INTRAMUSCULAR | Status: AC
  Administered 2015-06-03: 50 ug via INTRAVENOUS
  Filled 2015-06-03: qty 2

## 2015-06-03 MED ORDER — ONDANSETRON HCL 4 MG/2ML IJ SOLN
4.0000 mg | Freq: Once | INTRAMUSCULAR | Status: AC
  Administered 2015-06-03: 4 mg via INTRAVENOUS
  Filled 2015-06-03: qty 2

## 2015-06-03 MED ORDER — IOHEXOL 300 MG/ML  SOLN
100.0000 mL | Freq: Once | INTRAMUSCULAR | Status: AC | PRN
  Administered 2015-06-03: 100 mL via INTRAVENOUS

## 2015-06-03 MED ORDER — MORPHINE SULFATE (PF) 4 MG/ML IV SOLN
4.0000 mg | Freq: Once | INTRAVENOUS | Status: AC
  Administered 2015-06-03: 4 mg via INTRAVENOUS
  Filled 2015-06-03: qty 1

## 2015-06-03 MED ORDER — BENZOCAINE 20 % MT SOLN
OROMUCOSAL | Status: AC
  Administered 2015-06-03: 06:00:00
  Filled 2015-06-03: qty 57

## 2015-06-03 MED ORDER — FENTANYL CITRATE (PF) 100 MCG/2ML IJ SOLN
100.0000 ug | Freq: Once | INTRAMUSCULAR | Status: AC
  Administered 2015-06-03: 100 ug via INTRAVENOUS
  Filled 2015-06-03: qty 2

## 2015-06-03 MED ORDER — ONDANSETRON HCL 4 MG/2ML IJ SOLN
4.0000 mg | Freq: Once | INTRAMUSCULAR | Status: AC
  Administered 2015-06-03: 4 mg via INTRAVENOUS

## 2015-06-03 MED ORDER — ONDANSETRON HCL 4 MG/2ML IJ SOLN
INTRAMUSCULAR | Status: AC
  Administered 2015-06-03: 4 mg via INTRAVENOUS
  Filled 2015-06-03: qty 2

## 2015-06-03 NOTE — ED Notes (Signed)
Patient transported to CT 

## 2015-06-27 HISTORY — PX: TOTAL ABDOMINAL HYSTERECTOMY W/ BILATERAL SALPINGOOPHORECTOMY: SHX83

## 2015-07-07 ENCOUNTER — Ambulatory Visit: Payer: Medicare Other | Attending: Gynecology | Admitting: Gynecology

## 2015-07-07 ENCOUNTER — Encounter: Payer: Self-pay | Admitting: Gynecology

## 2015-07-07 VITALS — BP 151/77 | HR 80 | Temp 99.0°F | Resp 18 | Ht 60.0 in | Wt 145.5 lb

## 2015-07-07 DIAGNOSIS — C55 Malignant neoplasm of uterus, part unspecified: Secondary | ICD-10-CM | POA: Insufficient documentation

## 2015-07-07 DIAGNOSIS — Z4802 Encounter for removal of sutures: Secondary | ICD-10-CM

## 2015-07-07 MED ORDER — PROMETHAZINE HCL 12.5 MG PO TABS: 12.5000 mg | ORAL_TABLET | Freq: Two times a day (BID) | ORAL | Status: DC | PRN

## 2015-07-07 MED ORDER — LEVOFLOXACIN 500 MG PO TABS: 500.0000 mg | ORAL_TABLET | Freq: Every day | ORAL | Status: DC

## 2015-07-07 MED ORDER — OXYCODONE HCL 5 MG PO TABS: 5.0000 mg | ORAL_TABLET | Freq: Two times a day (BID) | ORAL | Status: DC | PRN

## 2015-07-07 NOTE — Patient Instructions (Addendum)
Start taking Levaquin antibiotic and continue wound care. Plan to follow-up with Dr. Fermin Schwab in 5 weeks. Please call our office if you develop a fever, if your wound does not appear to be healing or if you experience any additional signs or symptoms of an infection.

## 2015-07-07 NOTE — Progress Notes (Signed)
Consult Note: Gyn-Onc   JASA DUNDON 69 y.o. female  Chief Complaint  Patient presents with  . mucinous adenocarcinoma of uterus    Post-op check  . 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  Superficial wound separation healing well. Superficial cellulitis and lower portion of the wound.  Plan:  The patient will continue dressing changes as she is currently doing. She's given renew prescription for oxycodone (30) and Phenergan 12.5 mg for nausea. She also seems to have a superficial cellulitis and is given a prescription for Levaquin 500 mg daily for 10 days. She return for a six-week checkup. Levels of activity were reviewed.  The patient skin staples removed the wound cleaned and Steri-Strips applied.  History of present illness: Patient initially presented with a very large ovarian cyst. She underwent exploratory laparotomy total abdominal hysterectomy bilateral salpingo-oophorectomy on 06/30/2015. Final pathology showed a serous cystadenofibroma of the ovary as well as a mucinous carcinoma of the endometrium measuring 0.4 cm (well-differentiated. There was no myometrial invasion. Stage IA.    Review of Systems:10 point review of systems is negative except as noted in interval history.   Vitals: Blood pressure 151/77, pulse 80, temperature 99 F (37.2 C), temperature source Oral, resp. rate 18, height 5' (1.524 m), weight 145 lb 8 oz (65.998 kg), SpO2 100 %.  Physical Exam: General : The patient is a healthy woman in no acute distress.  HEENT: normocephalic, extraoccular movements normal; neck is supple without thyromegally  Lynphnodes: Supraclavicular and inguinal nodes not enlarged  Abdomen: There is a large ecchymotic area on either side of the midline incision. There is a small superficial (3 x 2 cm) area of  superficial wound separation which the patient is packing. The remainder the staples removed and Steri-Strips are applied. There is a slight amount of erythema on either side of the lower portion of the wound suggestive of an early cellulitis. Pelvic:   Deferred Lower extremities: No edema or varicosities. Normal range of motion      Allergies  Allergen Reactions  . Sulfa Antibiotics Hives  . Codeine Other (See Comments)    gi upset  . Penicillins Swelling  . Sulfonamide Derivatives Hives    Past Medical History  Diagnosis Date  . Intrinsic asthma   . Cough   . 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 GERD (gastroesophageal reflux disease)   . Hypothyroidism   . Depression   . Anxiety   . Hypertension   . Hypokalemia   . Diet-controlled diabetes mellitus ([place])   . Hyperlipidemia   . Central retinal artery occlusion, right eye     a. Dx 2016 - admitted with transient visual disturbance, had recently been dx with central retinal artery occlusion.    Past Surgical History  Procedure Laterality Date  . Tee without cardioversion N/A 05/26/2015    Procedure: TRANSESOPHAGEAL ECHOCARDIOGRAM (TEE);  Surgeon: Larey Dresser, MD;  Location: [facility];  Service: Cardiovascular;  Laterality: N/A;  . Total abdominal hysterectomy w/ bilateral salpingoophorectomy  06/27/15    and excision of umbilical hernia sac, performed by Dr. Fermin Schwab at [facility]  . acetaminophen (TYLENOL) 325 MG tablet Take 650 mg by mouth every 6 (six) hours as needed.    Marland Kitchen albuterol (  PROAIR HFA) 108 (90 BASE) MCG/ACT inhaler Inhale 2 puffs into the lungs every 4 (four) hours as needed.    . ALPRAZolam (XANAX) 0.25 MG tablet Take 0.25 mg by mouth every 12 (twelve) hours as needed.    Marland Kitchen aspirin EC 325 MG tablet Take 1 tablet (325 mg total) by mouth daily. 30 tablet 0  . atorvastatin (LIPITOR) 20 MG tablet Take 1 tablet (20 mg total)  by mouth daily at 6 PM. 30 tablet 1  . buPROPion (ZYBAN) 150 MG 12 hr tablet Take 450 mg by mouth daily.    . Calcium-Vitamin D (CALTRATE 600 PLUS-VIT D PO) Take 3 tablets by mouth daily.    . chlorpheniramine-HYDROcodone (TUSSIONEX PENNKINETIC ER) 10-8 MG/5ML SUER Take 5 mLs by mouth.    . clotrimazole-betamethasone (LOTRISONE) cream APP AA BID  3  . dexlansoprazole (DEXILANT) 60 MG capsule Take 60 mg by mouth daily.    Marland Kitchen dextromethorphan (DELSYM) 30 MG/5ML liquid 2 tsp. Every 12 hrs. As needed    . dextromethorphan-guaiFENesin (MUCINEX DM) 30-600 MG per 12 hr tablet Take 1 tablet by mouth every 12 (twelve) hours.    . DOCQLACE 100 MG capsule TK 1 C PO BID  0  . DYMISTA 137-50 MCG/ACT SUSP U 2 SPRAYS NASALLY BID  5  . Eszopiclone (ESZOPICLONE) 3 MG TABS Take 3 mg by mouth at bedtime. Take immediately before bedtime    . famotidine (PEPCID) 20 MG tablet Take 20 mg by mouth 2 (two) times daily.     . finasteride (PROSCAR) 5 MG tablet TK 1/2 T PO QD  5  . FLOVENT HFA 220 MCG/ACT inhaler INHALE 1 PUFF DAILY  3  . FLUoxetine (PROZAC) 20 MG capsule Take 20 mg by mouth daily.    Marland Kitchen glipiZIDE (GLUCOTROL XL) 2.5 MG 24 hr tablet Take 2.5 mg by mouth.    . hydrochlorothiazide (HYDRODIURIL) 25 MG tablet TK 1 T PO QAM  4  . Hydrocod Polst-Chlorphen Polst 10-8 MG CP12 Take by mouth.    Marland Kitchen ibuprofen (ADVIL,MOTRIN) 600 MG tablet TK 1 T PO Q 6 H PRN P  2  . latanoprost (XALATAN) 0.005 % ophthalmic solution INSTILL ONE DROP IN OU QHS  12  . levothyroxine (SYNTHROID, LEVOTHROID) 100 MCG tablet Take 100 mcg by mouth daily.    Marland Kitchen losartan (COZAAR) 100 MG tablet Take 1 tablet (100 mg total) by mouth daily.    . meloxicam (MOBIC) 7.5 MG tablet TK 2 TS PO QD  5  . Multiple Vitamins-Minerals (CENTRUM SILVER ULTRA WOMENS PO) Take 1 tablet by mouth daily.    . nebivolol (BYSTOLIC) 5 MG tablet Take 1 tablet (5 mg total) by mouth daily. 30 tablet   . ondansetron (ZOFRAN-ODT) 4 MG disintegrating tablet TK 1 T PO Q 6 H PRN  FOR UP TO 7 DAYS.  2  . oxyCODONE-acetaminophen (PERCOCET/ROXICET) 5-325 MG tablet TK 1 T PO Q 6 H PRN P  0  . oxymetazoline (AFRIN) 0.05 % nasal spray Place 2 sprays into the nose 2 (two) times daily. Max 5 days only    . penciclovir (DENAVIR) 1 % cream Apply 1 application topically 2 (two) times daily as needed.    . potassium chloride SA (K-DUR,KLOR-CON) 20 MEQ tablet Take 60 mEq by mouth daily.    . promethazine (PHENERGAN) 12.5 MG tablet TK 1 T PO Q 6 H PRN N FOR UP TO 7 DAYS  0  . traMADol (ULTRAM) 50 MG tablet 1 -2 every  4 hrs. As needed    . triamcinolone (NASACORT) 55 MCG/ACT nasal inhaler Place 2 sprays into the nose as needed.      No current facility-administered medications for this visit.    Social History   Social History  . Marital Status: Widowed    Spouse Name: N/A  . Number of Children: N/A  . Years of Education: N/A   Occupational History  . Not on file.   Social History Main Topics  . Smoking status: Never Smoker   . Smokeless tobacco: Not on file  . Alcohol Use: No  . Drug Use: No  . Sexual Activity: Not on file   Other Topics Concern  . Not on file   Social History Narrative    Family History  Problem Relation Age of Onset  . Asthma Sister   . Heart attack Father     died in his 63's of MI  . Stroke Father       Alvino Chapel, MD 07/07/2015, 3:07 PM

## 2015-08-11 ENCOUNTER — Ambulatory Visit: Payer: Medicare Other | Attending: Gynecology | Admitting: Gynecology

## 2015-08-11 VITALS — BP 148/80 | HR 85 | Temp 98.5°F | Resp 18 | Ht 60.0 in | Wt 148.6 lb

## 2015-08-11 DIAGNOSIS — C55 Malignant neoplasm of uterus, part unspecified: Secondary | ICD-10-CM | POA: Insufficient documentation

## 2015-08-11 DIAGNOSIS — D271 Benign neoplasm of left ovary: Secondary | ICD-10-CM

## 2015-08-11 NOTE — Patient Instructions (Signed)
Plan to follow up in six months or sooner if needed.  Please call for any questions or concerns.

## 2015-08-11 NOTE — Progress Notes (Signed)
Consult Note: Gyn-Onc   Vickie Chang 69 y.o. female  No chief complaint on file.   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 Excellent postoperative recovery.   Plan: The patient may return to full levels of activity. She return to see me in 6 months.     History of present illness: Patient initially presented with a very large ovarian cyst. She underwent exploratory laparotomy total abdominal hysterectomy bilateral salpingo-oophorectomy on 06/30/2015. Final pathology showed a serous cystadenofibroma of the ovary as well as a mucinous carcinoma of the endometrium measuring 0.4 cm (well-differentiated. There was no myometrial invasion. Stage IA. No adjuvant therapy was indicated.    Review of Systems:10 point review of systems is negative except as noted in interval history.   Vitals: Blood pressure 148/80, pulse 85, temperature 98.5 F (36.9 C), temperature source Oral, resp. rate 18, height 5' (1.524 m), weight 148 lb 9.6 oz (67.405 kg), SpO2 99 %.  Physical Exam: General : The patient is a healthy woman in no acute distress.  HEENT: normocephalic, extraoccular movements normal; neck is supple without thyromegally  Lynphnodes: Supraclavicular and inguinal nodes not enlarged  Abdomen:  Midline incision is well-healed. The abdomen is soft nontender no masses again a megaly ascites or hernias are noted. Pelvic:  EGBUS normal  Vagina normal vaginal cuff is healing well. Cervix and uterus are surgically absent  Adnexa without masses or tenderness. Lower extremities: No edema or varicosities. Normal range of motion      Allergies  Allergen Reactions  . Sulfa Antibiotics Hives  . Codeine Other (See Comments)    gi upset  . Penicillins Swelling  . Sulfonamide Derivatives Hives    Past Medical History  Diagnosis Date  . Intrinsic asthma   .  Cough   . 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 GERD (gastroesophageal reflux disease)   . Hypothyroidism   . Depression   . Anxiety   . Hypertension   . Hypokalemia   . Diet-controlled diabetes mellitus ([place])   . Hyperlipidemia   . Central retinal artery occlusion, right eye     a. Dx 2016 - admitted with transient visual disturbance, had recently been dx with central retinal artery occlusion.    Past Surgical History  Procedure Laterality Date  . Tee without cardioversion N/A 05/26/2015    Procedure: TRANSESOPHAGEAL ECHOCARDIOGRAM (TEE);  Surgeon: Larey Dresser, MD;  Location: [facility];  Service: Cardiovascular;  Laterality: N/A;  . Total abdominal hysterectomy w/ bilateral salpingoophorectomy  06/27/15    and excision of umbilical hernia sac, performed by Dr. Fermin Schwab at [facility]  . albuterol (PROAIR HFA) 108 (90 BASE) MCG/ACT inhaler Inhale 2 puffs into the lungs every 4 (four) hours as needed.    . ALPRAZolam (XANAX) 0.25 MG tablet Take 0.25 mg by mouth every 12 (twelve) hours as needed.    Marland Kitchen aspirin EC 325 MG tablet Take 1 tablet (325 mg total) by mouth daily. (Patient taking differently: Take 81 mg by mouth daily. ) 30 tablet 0  . atorvastatin (LIPITOR) 20 MG tablet Take 1 tablet (20 mg total) by mouth daily at 6 PM. 30 tablet 1  . buPROPion (ZYBAN) 150 MG 12 hr tablet Take 450 mg by mouth daily.    . Calcium-Vitamin D (CALTRATE 600 PLUS-VIT  D PO) Take 3 tablets by mouth daily.    . clotrimazole-betamethasone (LOTRISONE) cream APP AA BID  3  . DYMISTA 137-50 MCG/ACT SUSP U 2 SPRAYS NASALLY BID  5  . Eszopiclone (ESZOPICLONE) 3 MG TABS Take 3 mg by mouth at bedtime. Take immediately before bedtime    . famotidine (PEPCID) 20 MG tablet Take 20 mg by mouth 2 (two) times daily.     . finasteride (PROSCAR) 5 MG tablet TK 1/2 T PO QD  5  . acetaminophen (TYLENOL) 325 MG tablet Take 650 mg by  mouth every 6 (six) hours as needed.    . chlorpheniramine-HYDROcodone (TUSSIONEX PENNKINETIC ER) 10-8 MG/5ML SUER Take 5 mLs by mouth.    . dexlansoprazole (DEXILANT) 60 MG capsule Take 60 mg by mouth daily.    Marland Kitchen dextromethorphan (DELSYM) 30 MG/5ML liquid 2 tsp. Every 12 hrs. As needed    . dextromethorphan-guaiFENesin (MUCINEX DM) 30-600 MG per 12 hr tablet Take 1 tablet by mouth every 12 (twelve) hours.    . DOCQLACE 100 MG capsule TK 1 C PO BID  0  . FLOVENT HFA 220 MCG/ACT inhaler INHALE 1 PUFF DAILY  3  . FLUoxetine (PROZAC) 20 MG capsule Take 20 mg by mouth daily.    Marland Kitchen glipiZIDE (GLUCOTROL XL) 2.5 MG 24 hr tablet Take 2.5 mg by mouth.    . hydrochlorothiazide (HYDRODIURIL) 25 MG tablet TK 1 T PO QAM  4  . Hydrocod Polst-Chlorphen Polst 10-8 MG CP12 Take by mouth.    Marland Kitchen ibuprofen (ADVIL,MOTRIN) 600 MG tablet TK 1 T PO Q 6 H PRN P  2  . latanoprost (XALATAN) 0.005 % ophthalmic solution INSTILL ONE DROP IN OU QHS  12  . levofloxacin (LEVAQUIN) 500 MG tablet Take 1 tablet (500 mg total) by mouth daily. 10 tablet 0  . levothyroxine (SYNTHROID, LEVOTHROID) 100 MCG tablet Take 100 mcg by mouth daily.    Marland Kitchen losartan (COZAAR) 100 MG tablet Take 1 tablet (100 mg total) by mouth daily.    . meloxicam (MOBIC) 7.5 MG tablet TK 2 TS PO QD  5  . Multiple Vitamins-Minerals (CENTRUM SILVER ULTRA WOMENS PO) Take 1 tablet by mouth daily.    . nebivolol (BYSTOLIC) 5 MG tablet Take 1 tablet (5 mg total) by mouth daily. 30 tablet   . ondansetron (ZOFRAN-ODT) 4 MG disintegrating tablet TK 1 T PO Q 6 H PRN FOR UP TO 7 DAYS.  2  . oxyCODONE (OXY IR/ROXICODONE) 5 MG immediate release tablet Take 1 tablet (5 mg total) by mouth 2 (two) times daily as needed (pain). 30 tablet 0  . oxyCODONE-acetaminophen (PERCOCET/ROXICET) 5-325 MG tablet TK 1 T PO Q 6 H PRN P  0  . oxymetazoline (AFRIN) 0.05 % nasal spray Place 2 sprays into the nose 2 (two) times daily. Max 5 days only    . penciclovir (DENAVIR) 1 % cream Apply 1  application topically 2 (two) times daily as needed.    . potassium chloride SA (K-DUR,KLOR-CON) 20 MEQ tablet Take 60 mEq by mouth daily.    . promethazine (PHENERGAN) 12.5 MG tablet Take 1 tablet (12.5 mg total) by mouth 2 (two) times daily as needed for nausea or vomiting. 30 tablet 0  . traMADol (ULTRAM) 50 MG tablet 1 -2 every 4 hrs. As needed    . triamcinolone (NASACORT) 55 MCG/ACT nasal inhaler Place 2 sprays into the nose as needed.      No current facility-administered medications for this visit.  Social History   Social History  . Marital Status: Widowed    Spouse Name: N/A  . Number of Children: N/A  . Years of Education: N/A   Occupational History  . Not on file.   Social History Main Topics  . Smoking status: Never Smoker   . Smokeless tobacco: Not on file  . Alcohol Use: No  . Drug Use: No  . Sexual Activity: Not on file   Other Topics Concern  . Not on file   Social History Narrative    Family History  Problem Relation Age of Onset  . Asthma Sister   . Heart attack Father     died in his 40's of MI  . Stroke Father       Alvino Chapel, MD 08/11/2015, 1:10 PM

## 2015-08-11 NOTE — Progress Notes (Signed)
Verified all oral medications , patient is a poor historian , does not know all medication . Patient states " I don't know I just take them". Discussed with the patient the importance of knowing why she is taking her medications.

## 2016-02-02 ENCOUNTER — Ambulatory Visit: Payer: Medicare Other | Admitting: Gynecology

## 2016-02-09 ENCOUNTER — Ambulatory Visit: Payer: Medicare Other | Admitting: Gynecology

## 2017-10-09 ENCOUNTER — Other Ambulatory Visit: Payer: Self-pay | Admitting: Family Medicine

## 2017-10-09 DIAGNOSIS — Z139 Encounter for screening, unspecified: Secondary | ICD-10-CM

## 2017-12-22 ENCOUNTER — Ambulatory Visit
Admission: RE | Admit: 2017-12-22 | Discharge: 2017-12-22 | Disposition: A | Discharge status: Home / Self Care | Payer: Medicare Other | Source: Ambulatory Visit | Attending: Family Medicine | Admitting: Family Medicine

## 2017-12-22 DIAGNOSIS — Z139 Encounter for screening, unspecified: Secondary | ICD-10-CM

## 2019-06-16 ENCOUNTER — Other Ambulatory Visit: Payer: Self-pay | Admitting: Physician Assistant

## 2019-06-16 DIAGNOSIS — E2839 Other primary ovarian failure: Secondary | ICD-10-CM

## 2019-06-16 DIAGNOSIS — Z1231 Encounter for screening mammogram for malignant neoplasm of breast: Secondary | ICD-10-CM

## 2019-07-26 ENCOUNTER — Emergency Department (HOSPITAL_BASED_OUTPATIENT_CLINIC_OR_DEPARTMENT_OTHER)
Admission: EM | Admit: 2019-07-26 | Discharge: 2019-07-26 | Disposition: A | Discharge status: Home / Self Care | Payer: Medicare Other | Attending: Emergency Medicine | Admitting: Emergency Medicine

## 2019-07-26 ENCOUNTER — Encounter (HOSPITAL_BASED_OUTPATIENT_CLINIC_OR_DEPARTMENT_OTHER): Payer: Self-pay | Admitting: *Deleted

## 2019-07-26 ENCOUNTER — Emergency Department (HOSPITAL_BASED_OUTPATIENT_CLINIC_OR_DEPARTMENT_OTHER): Payer: Medicare Other

## 2019-07-26 ENCOUNTER — Other Ambulatory Visit: Payer: Self-pay

## 2019-07-26 DIAGNOSIS — I10 Essential (primary) hypertension: Secondary | ICD-10-CM | POA: Insufficient documentation

## 2019-07-26 DIAGNOSIS — W19XXXA Unspecified fall, initial encounter: Secondary | ICD-10-CM

## 2019-07-26 DIAGNOSIS — E039 Hypothyroidism, unspecified: Secondary | ICD-10-CM | POA: Diagnosis not present

## 2019-07-26 DIAGNOSIS — W010XXA Fall on same level from slipping, tripping and stumbling without subsequent striking against object, initial encounter: Secondary | ICD-10-CM | POA: Insufficient documentation

## 2019-07-26 DIAGNOSIS — Z8673 Personal history of transient ischemic attack (TIA), and cerebral infarction without residual deficits: Secondary | ICD-10-CM | POA: Diagnosis not present

## 2019-07-26 DIAGNOSIS — Z7982 Long term (current) use of aspirin: Secondary | ICD-10-CM | POA: Diagnosis not present

## 2019-07-26 DIAGNOSIS — M25551 Pain in right hip: Secondary | ICD-10-CM | POA: Diagnosis present

## 2019-07-26 DIAGNOSIS — M25559 Pain in unspecified hip: Secondary | ICD-10-CM

## 2019-07-26 DIAGNOSIS — E119 Type 2 diabetes mellitus without complications: Secondary | ICD-10-CM | POA: Diagnosis not present

## 2019-07-26 NOTE — Discharge Instructions (Signed)
Your xray was negative for any breaks.  Please elevate your leg with 2-3 pillows and apply ice to reduce swelling. I would recommend 1,000 mg Tylenol every 8 hours as needed for the pain.  Follow up with your PCP regarding your ED visit today.

## 2019-07-26 NOTE — ED Notes (Signed)
A fist size bump to right side, tender to touch and patient stated that it stinging.  Ambulatory.

## 2019-07-26 NOTE — ED Provider Notes (Signed)
[facility] EMERGENCY DEPARTMENT Provider Note   CSN: [id number] Arrival date & time: 07/26/19  1720     History   Chief Complaint Chief Complaint  Patient presents with  . Fall  . Hip Injury    HPI Vickie Chang is a 73 y.o. female with PMHx HTN, HLD, GERD who presents to the ED today for sudden onset, constant, achy, right hip pain s/p mechanical fall that occurred around 3 PM today.  Reports that she was walking to her mailbox when she slipped on some leaves causing her to fall and land on her right hip.  No head injury or loss of consciousness.  She states that she was able to get up immediately from falling.  She reports immediate swelling to the lateral aspect of her right hip/thigh.  Patient states that she has been full to ambulate on the leg but notes some pain with it.  She reports that she called her daughter to let her know what happened and her daughter told her to come to the ED immediately for further evaluation.  Patient has not taken anything for pain.  No other complaints at this time.        Past Medical History:  Diagnosis Date  . Anxiety   . Central retinal artery occlusion, right eye    a. Dx 2016 - admitted with transient visual disturbance, had recently been dx with central retinal artery occlusion.  . Cough   . Depression   . Diet-controlled diabetes mellitus ([place])   . GERD (gastroesophageal reflux disease)   . Hyperlipidemia   . Hypertension   . Hypokalemia   . Hypothyroidism   . Intrinsic asthma   . Legionnaire's disease [facility])    1984    Patient Active Problem List   Diagnosis Date Noted  . Family history of coronary artery disease in father 05/26/2015  . TIA (transient ischemic attack) 04/28/2015  . Hypokalemia 04/28/2015  . HLD (hyperlipidemia)   . Change in vision 04/27/2015  . Central retinal artery occlusion of right eye 04/27/2015  . Intrinsic asthma   . Hypothyroidism   . GERD (gastroesophageal reflux disease)   .  Depression   . Anxiety   . Essential hypertension, benign 01/04/2008  . COUGH 10/19/2007    Past Surgical History:  Procedure Laterality Date  . TEE WITHOUT CARDIOVERSION N/A 05/26/2015   Procedure: TRANSESOPHAGEAL ECHOCARDIOGRAM (TEE);  Surgeon: Larey Dresser, MD;  Location: [place];  Service: Cardiovascular;  Laterality: N/A;  . TOTAL ABDOMINAL HYSTERECTOMY W/ BILATERAL SALPINGOOPHORECTOMY  06/27/15   and excision of umbilical hernia sac, performed by Dr. Fermin Schwab at [facility]     OB History   No obstetric history on file.      Home Medications    Prior to Admission medications   Medication Sig Start Date End Date Taking? Authorizing Provider  ALPRAZolam (XANAX) 0.25 MG tablet Take 0.25 mg by mouth every 12 (twelve) hours as needed.   Yes [provider]  aspirin EC 325 MG tablet Take 1 tablet (325 mg total) by mouth daily. Patient taking differently: Take 81 mg by mouth daily.  04/28/15  Yes Tat, Shanon Brow, MD  atorvastatin (LIPITOR) 20 MG tablet Take 1 tablet (20 mg total) by mouth daily at 6 PM. 04/28/15  Yes Tat, Shanon Brow, MD  buPROPion (ZYBAN) 150 MG 12 hr tablet Take 450 mg by mouth daily.   Yes [provider]  dexlansoprazole (DEXILANT) 60 MG capsule Take 60 mg by  mouth daily.   Yes [provider]  Eszopiclone (ESZOPICLONE) 3 MG TABS Take 3 mg by mouth at bedtime. Take immediately before bedtime   Yes [provider]  famotidine (PEPCID) 20 MG tablet Take 20 mg by mouth 2 (two) times daily.    Yes [provider]  finasteride (PROSCAR) 5 MG tablet Take 2.5 mg by mouth daily.  05/20/15  Yes [provider]  latanoprost (XALATAN) 0.005 % ophthalmic solution INSTILL ONE DROP IN OU QHS 06/18/15  Yes [provider]  levothyroxine (SYNTHROID, LEVOTHROID) 100 MCG tablet Take 100 mcg by mouth daily.   Yes [provider]  losartan (COZAAR) 100 MG tablet Take 1 tablet (100 mg total) by mouth daily. 04/28/15   Yes Tat, Shanon Brow, MD  meloxicam (MOBIC) 7.5 MG tablet TK 2 TS PO QD 06/18/15  Yes [provider]  Multiple Vitamins-Minerals (CENTRUM SILVER ULTRA WOMENS PO) Take 1 tablet by mouth daily.   Yes [provider]  nebivolol (BYSTOLIC) 5 MG tablet Take 1 tablet (5 mg total) by mouth daily. 04/28/15  Yes Tat, Shanon Brow, MD  potassium chloride SA (K-DUR,KLOR-CON) 20 MEQ tablet Take 60 mEq by mouth daily.   Yes [provider]  acetaminophen (TYLENOL) 325 MG tablet Take 650 mg by mouth every 6 (six) hours as needed.    [provider]  clotrimazole-betamethasone (LOTRISONE) cream APP AA BID 05/20/15   [provider]  dextromethorphan-guaiFENesin (MUCINEX DM) 30-600 MG per 12 hr tablet Take 1 tablet by mouth every 12 (twelve) hours.    [provider]  [place] HFA 220 MCG/ACT inhaler INHALE 1 PUFF DAILY 05/20/15   [provider]  hydrochlorothiazide (HYDRODIURIL) 25 MG tablet TK 1 T PO QAM 06/18/15   [provider]  Hydrocod Polst-Chlorphen Polst 10-8 MG CP12 Take by mouth.    [provider]  ondansetron (ZOFRAN-ODT) 4 MG disintegrating tablet TK 1 T PO Q 6 H PRN FOR UP TO 7 DAYS. 06/18/15   [provider]  traMADol (ULTRAM) 50 MG tablet 1 -2 every 4 hrs. As needed    [provider]    Family History Family History  Problem Relation Age of Onset  . Heart attack Father        died in his 12's of MI  . Stroke Father   . Asthma Sister     Social History Social History   Tobacco Use  . Smoking status: Never Smoker  . Smokeless tobacco: Never Used  Substance Use Topics  . Alcohol use: No  . Drug use: No     Allergies   Sulfa antibiotics, Codeine, Penicillins, and Sulfonamide derivatives   Review of Systems Review of Systems  Constitutional: Negative for chills and fever.  Musculoskeletal: Positive for arthralgias.  Neurological: Negative for syncope and headaches.     Physical Exam  Updated Vital Signs BP (!) 193/89   Pulse 91   Temp 98.3 F (36.8 C) (Oral)   Resp 20   Ht 5' (1.524 m)   Wt 64.9 kg   SpO2 100%   BMI 27.93 kg/m   Physical Exam Vitals signs and nursing note reviewed.  Constitutional:      Appearance: She is not ill-appearing.  HENT:     Head: Normocephalic and atraumatic.  Eyes:     Conjunctiva/sclera: Conjunctivae normal.  Cardiovascular:     Rate and Rhythm: Normal rate and regular rhythm.     Pulses: Normal pulses.  Pulmonary:  Effort: Pulmonary effort is normal.     Breath sounds: Normal breath sounds. No wheezing, rhonchi or rales.  Musculoskeletal:     Comments: Large hematoma noted to right proximal thigh on lateral aspect with small amount of ecchymosis and TTP. No obvious tenderness to ASIS. No tenderness to distal aspect of femur, knee, tib/fib, ankle, or foot on right side. Pain illicited with log roll of hip and hip flexion. Strength 5/5 bilaterally. Sensation intact throughout. Compartments soft. 2+ DP and PT pulses.   Skin:    General: Skin is warm and dry.     Coloration: Skin is not jaundiced.  Neurological:     Mental Status: She is alert.      ED Treatments / Results  Labs (all labs ordered are listed, but only abnormal results are displayed) Labs Reviewed - No data to display  EKG None  Radiology Dg Hip Unilat With Pelvis 2-3 Views Right  Result Date: 07/26/2019 CLINICAL DATA:  Right hip pain status post fall EXAM: DG HIP (WITH OR WITHOUT PELVIS) 2-3V RIGHT COMPARISON:  None. FINDINGS: There is no evidence of hip fracture or dislocation. There is degenerative disease with disc height loss at L4-5. There is no evidence of arthropathy or other focal bone abnormality. There is soft tissue contusion overlying the right greater trochanter. IMPRESSION: No acute osseous injury of the right hip. Soft tissue contusion overlying the right greater trochanter. Electronically Signed   By: Kathreen Devoid   On: 07/26/2019 18:42    Dg Femur, Min 2 Views Right  Result Date: 07/26/2019 CLINICAL DATA:  Status post fall, right hip pain EXAM: RIGHT FEMUR 2 VIEWS COMPARISON:  None. FINDINGS: There is no evidence of acute fracture or dislocation. There is soft tissue contusion overlying the right greater trochanter. 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 IMPRESSION: No acute osseous injury of the right femur. Soft tissue contusion overlying the right greater trochanter. Electronically Signed   By: Kathreen Devoid   On: 07/26/2019 18:44    Procedures Procedures (including critical care time)  Medications Ordered in ED Medications - No data to display   Initial Impression / Assessment and Plan / ED Course  I have reviewed the triage vital signs and the nursing notes.  Pertinent labs & imaging results that were available during my care of the patient were reviewed by me and considered in my medical decision making (see chart for details).    73 year old female who presents to the ED today with right hip pain status post mechanical fall that occurred earlier today.  Patient has large hematoma to the outer aspect of her hip with tenderness to palpation.  She was able to ambulate from the waiting room into her room without difficulty.  Will obtain x-rays today to rule out fractures.   Xrays negative for fractures. Does show soft tissue contusion consistent with exam. Have advised tylenol PRN at home and RICE therapy. Pt to follow up with PCP. She is in agreement with plan at this time and stable for discharge home.   This note was prepared using Dragon voice recognition software and may include unintentional dictation errors due to the inherent limitations of voice recognition software.      Final Clinical Impressions(s) / ED Diagnoses   Final diagnoses:  Fall, initial encounter  Hip pain    ED Discharge Orders    None       Eustaquio Maize, PA-C 07/26/19 1859     Rogene Houston,  Nicki Reaper, MD 07/30/19 1535

## 2019-07-26 NOTE — ED Notes (Signed)
ED Provider at bedside. 

## 2019-07-26 NOTE — ED Triage Notes (Signed)
She slid and fell today. Injury to her right hip. Bruising.

## 2019-07-26 NOTE — ED Notes (Signed)
Patient transported to X-ray 

## 2019-07-26 NOTE — ED Provider Notes (Signed)
Medical screening examination/treatment/procedure(s) were conducted as a shared visit with non-physician practitioner(s) and myself.  I personally evaluated the patient during the encounter.      Patient seen by me along with the physician assistant.  Patient slipped on some leaves this morning going down onto her right hip.  Has a large area of contusion on that right hip probably measuring about 7 x 5 cm.  Patient able to ambulate without significant pain.  No other complaints.  Did not hit her head no loss of consciousness.  No back pain or neck pain.  Felt as if she overturned her ankle a little bit but no complaint of any ankle pain at this time.  Dorsalis pedis pulse distally and bilaterally is 2+.  Good radial pulses.  No obvious deformity to the right hip.  No shortening of the leg.  We will get x-rays of the right hip.  May follow-up with CT scan to rule out hairline fracture.  Certainly the mechanism of injury suggestive that she could have done something to the right hip.   Fredia Sorrow, MD 07/26/19 1807

## 2019-08-27 ENCOUNTER — Ambulatory Visit
Admission: RE | Admit: 2019-08-27 | Discharge: 2019-08-27 | Disposition: A | Discharge status: Home / Self Care | Payer: Medicare Other | Source: Ambulatory Visit | Attending: Physician Assistant | Admitting: Physician Assistant

## 2019-08-27 ENCOUNTER — Other Ambulatory Visit: Payer: Self-pay

## 2019-08-27 DIAGNOSIS — Z1231 Encounter for screening mammogram for malignant neoplasm of breast: Secondary | ICD-10-CM

## 2019-09-30 ENCOUNTER — Other Ambulatory Visit: Payer: Medicare Other

## 2020-02-03 ENCOUNTER — Emergency Department (HOSPITAL_BASED_OUTPATIENT_CLINIC_OR_DEPARTMENT_OTHER): Payer: Medicare Other

## 2020-02-03 ENCOUNTER — Inpatient Hospital Stay (HOSPITAL_BASED_OUTPATIENT_CLINIC_OR_DEPARTMENT_OTHER)
Admission: EM | Admit: 2020-02-03 | Discharge: 2020-02-06 | DRG: 419 | Disposition: A | Discharge status: Home / Self Care | Payer: Medicare Other | Attending: Internal Medicine | Admitting: Internal Medicine

## 2020-02-03 ENCOUNTER — Other Ambulatory Visit: Payer: Self-pay

## 2020-02-03 ENCOUNTER — Encounter (HOSPITAL_BASED_OUTPATIENT_CLINIC_OR_DEPARTMENT_OTHER): Payer: Self-pay | Admitting: Emergency Medicine

## 2020-02-03 ENCOUNTER — Inpatient Hospital Stay (HOSPITAL_BASED_OUTPATIENT_CLINIC_OR_DEPARTMENT_OTHER): Payer: Medicare Other

## 2020-02-03 DIAGNOSIS — F419 Anxiety disorder, unspecified: Secondary | ICD-10-CM | POA: Diagnosis present

## 2020-02-03 DIAGNOSIS — R932 Abnormal findings on diagnostic imaging of liver and biliary tract: Secondary | ICD-10-CM

## 2020-02-03 DIAGNOSIS — E1165 Type 2 diabetes mellitus with hyperglycemia: Secondary | ICD-10-CM | POA: Diagnosis present

## 2020-02-03 DIAGNOSIS — J45909 Unspecified asthma, uncomplicated: Secondary | ICD-10-CM | POA: Diagnosis present

## 2020-02-03 DIAGNOSIS — R1013 Epigastric pain: Secondary | ICD-10-CM | POA: Diagnosis present

## 2020-02-03 DIAGNOSIS — Z90722 Acquired absence of ovaries, bilateral: Secondary | ICD-10-CM

## 2020-02-03 DIAGNOSIS — K219 Gastro-esophageal reflux disease without esophagitis: Secondary | ICD-10-CM | POA: Diagnosis present

## 2020-02-03 DIAGNOSIS — Z79890 Hormone replacement therapy (postmenopausal): Secondary | ICD-10-CM | POA: Diagnosis not present

## 2020-02-03 DIAGNOSIS — Z79899 Other long term (current) drug therapy: Secondary | ICD-10-CM

## 2020-02-03 DIAGNOSIS — Z20822 Contact with and (suspected) exposure to covid-19: Secondary | ICD-10-CM | POA: Diagnosis present

## 2020-02-03 DIAGNOSIS — E785 Hyperlipidemia, unspecified: Secondary | ICD-10-CM | POA: Diagnosis present

## 2020-02-03 DIAGNOSIS — K859 Acute pancreatitis without necrosis or infection, unspecified: Secondary | ICD-10-CM

## 2020-02-03 DIAGNOSIS — F329 Major depressive disorder, single episode, unspecified: Secondary | ICD-10-CM | POA: Diagnosis present

## 2020-02-03 DIAGNOSIS — K7581 Nonalcoholic steatohepatitis (NASH): Secondary | ICD-10-CM | POA: Diagnosis present

## 2020-02-03 DIAGNOSIS — K8050 Calculus of bile duct without cholangitis or cholecystitis without obstruction: Secondary | ICD-10-CM

## 2020-02-03 DIAGNOSIS — R109 Unspecified abdominal pain: Secondary | ICD-10-CM | POA: Diagnosis present

## 2020-02-03 DIAGNOSIS — Z7951 Long term (current) use of inhaled steroids: Secondary | ICD-10-CM

## 2020-02-03 DIAGNOSIS — I10 Essential (primary) hypertension: Secondary | ICD-10-CM | POA: Diagnosis present

## 2020-02-03 DIAGNOSIS — K8070 Calculus of gallbladder and bile duct without cholecystitis without obstruction: Secondary | ICD-10-CM | POA: Diagnosis present

## 2020-02-03 DIAGNOSIS — E039 Hypothyroidism, unspecified: Secondary | ICD-10-CM | POA: Diagnosis present

## 2020-02-03 DIAGNOSIS — Z90710 Acquired absence of both cervix and uterus: Secondary | ICD-10-CM | POA: Diagnosis not present

## 2020-02-03 DIAGNOSIS — Z791 Long term (current) use of non-steroidal anti-inflammatories (NSAID): Secondary | ICD-10-CM

## 2020-02-03 DIAGNOSIS — R7989 Other specified abnormal findings of blood chemistry: Secondary | ICD-10-CM

## 2020-02-03 DIAGNOSIS — Z7982 Long term (current) use of aspirin: Secondary | ICD-10-CM

## 2020-02-03 DIAGNOSIS — K851 Biliary acute pancreatitis without necrosis or infection: Principal | ICD-10-CM | POA: Diagnosis present

## 2020-02-03 DIAGNOSIS — E876 Hypokalemia: Secondary | ICD-10-CM | POA: Diagnosis present

## 2020-02-03 DIAGNOSIS — R52 Pain, unspecified: Secondary | ICD-10-CM

## 2020-02-03 LAB — CBC WITH DIFFERENTIAL/PLATELET
Abs Immature Granulocytes: 0.06 10*3/uL (ref 0.00–0.07)
Basophils Absolute: 0 10*3/uL (ref 0.0–0.1)
Basophils Relative: 0 %
Eosinophils Absolute: 0.1 10*3/uL (ref 0.0–0.5)
Eosinophils Relative: 1 %
HCT: 38.4 % (ref 36.0–46.0)
Hemoglobin: 13.2 g/dL (ref 12.0–15.0)
Immature Granulocytes: 0 %
Lymphocytes Relative: 4 %
Lymphs Abs: 0.6 10*3/uL — ABNORMAL LOW (ref 0.7–4.0)
MCH: 29 pg (ref 26.0–34.0)
MCHC: 34.4 g/dL (ref 30.0–36.0)
MCV: 84.4 fL (ref 80.0–100.0)
Monocytes Absolute: 0.1 10*3/uL (ref 0.1–1.0)
Monocytes Relative: 1 %
Neutro Abs: 16.9 10*3/uL — ABNORMAL HIGH (ref 1.7–7.7)
Neutrophils Relative %: 94 %
Platelets: 246 10*3/uL (ref 150–400)
RBC: 4.55 MIL/uL (ref 3.87–5.11)
RDW: 13.4 % (ref 11.5–15.5)
WBC: 17.9 10*3/uL — ABNORMAL HIGH (ref 4.0–10.5)
nRBC: 0 % (ref 0.0–0.2)

## 2020-02-03 LAB — COMPREHENSIVE METABOLIC PANEL
ALT: 94 U/L — ABNORMAL HIGH (ref 0–44)
AST: 181 U/L — ABNORMAL HIGH (ref 15–41)
Albumin: 4 g/dL (ref 3.5–5.0)
Alkaline Phosphatase: 110 U/L (ref 38–126)
Anion gap: 11 (ref 5–15)
BUN: 11 mg/dL (ref 8–23)
CO2: 27 mmol/L (ref 22–32)
Calcium: 8.9 mg/dL (ref 8.9–10.3)
Chloride: 103 mmol/L (ref 98–111)
Creatinine, Ser: 0.98 mg/dL (ref 0.44–1.00)
GFR calc Af Amer: >60 mL/min (ref >60)
GFR calc non Af Amer: 58 mL/min — ABNORMAL LOW (ref >60)
Glucose, Bld: 153 mg/dL — ABNORMAL HIGH (ref 70–99)
Potassium: 3.1 mmol/L — ABNORMAL LOW (ref 3.5–5.1)
Sodium: 141 mmol/L (ref 135–145)
Total Bilirubin: 1.2 mg/dL (ref 0.3–1.2)
Total Protein: 6.9 g/dL (ref 6.5–8.1)

## 2020-02-03 LAB — SARS CORONAVIRUS 2 BY RT PCR (HOSPITAL ORDER, PERFORMED IN ~~LOC~~ HOSPITAL LAB): SARS Coronavirus 2: NEGATIVE

## 2020-02-03 LAB — TROPONIN I (HIGH SENSITIVITY)
Troponin I (High Sensitivity): 5 ng/L (ref <18)
Troponin I (High Sensitivity): 7 ng/L (ref <18)

## 2020-02-03 LAB — LIPASE, BLOOD: Lipase: 81 U/L — ABNORMAL HIGH (ref 11–51)

## 2020-02-03 LAB — CBG MONITORING, ED: Glucose-Capillary: 190 mg/dL — ABNORMAL HIGH (ref 70–99)

## 2020-02-03 MED ORDER — HEPARIN SODIUM (PORCINE) 5000 UNIT/ML IJ SOLN: 5000.0000 [IU] | Freq: Three times a day (TID) | INTRAMUSCULAR | Status: DC

## 2020-02-03 MED ORDER — HYDROCODONE-ACETAMINOPHEN 5-325 MG PO TABS
1.0000 | ORAL_TABLET | Freq: Four times a day (QID) | ORAL | Status: DC | PRN
  Administered 2020-02-04: 1 via ORAL
  Filled 2020-02-03 (×2): qty 1

## 2020-02-03 MED ORDER — FENTANYL CITRATE (PF) 100 MCG/2ML IJ SOLN: 100.0000 ug | Freq: Once | INTRAMUSCULAR | Status: DC

## 2020-02-03 MED ORDER — CIPROFLOXACIN IN D5W 400 MG/200ML IV SOLN
400.0000 mg | Freq: Two times a day (BID) | INTRAVENOUS | Status: DC
  Administered 2020-02-03 – 2020-02-06 (×6): 400 mg via INTRAVENOUS
  Filled 2020-02-03 (×6): qty 200

## 2020-02-03 MED ORDER — ONDANSETRON HCL 4 MG PO TABS: 4.0000 mg | ORAL_TABLET | Freq: Four times a day (QID) | ORAL | Status: DC | PRN

## 2020-02-03 MED ORDER — ACETAMINOPHEN 325 MG PO TABS
650.0000 mg | ORAL_TABLET | Freq: Once | ORAL | Status: AC
  Administered 2020-02-03: 650 mg via ORAL
  Filled 2020-02-03: qty 2

## 2020-02-03 MED ORDER — ONDANSETRON HCL 4 MG/2ML IJ SOLN
4.0000 mg | Freq: Four times a day (QID) | INTRAMUSCULAR | Status: DC | PRN
  Administered 2020-02-03 – 2020-02-05 (×3): 4 mg via INTRAVENOUS
  Filled 2020-02-03 (×3): qty 2

## 2020-02-03 MED ORDER — PANTOPRAZOLE SODIUM 40 MG IV SOLR
40.0000 mg | INTRAVENOUS | Status: DC
  Administered 2020-02-03 – 2020-02-05 (×3): 40 mg via INTRAVENOUS
  Filled 2020-02-03 (×3): qty 40

## 2020-02-03 MED ORDER — METRONIDAZOLE IN NACL 5-0.79 MG/ML-% IV SOLN
500.0000 mg | Freq: Once | INTRAVENOUS | Status: AC
  Administered 2020-02-03: 500 mg via INTRAVENOUS
  Filled 2020-02-03: qty 100

## 2020-02-03 MED ORDER — CIPROFLOXACIN IN D5W 400 MG/200ML IV SOLN
400.0000 mg | Freq: Once | INTRAVENOUS | Status: AC
  Administered 2020-02-03: 400 mg via INTRAVENOUS
  Filled 2020-02-03: qty 200

## 2020-02-03 MED ORDER — SODIUM CHLORIDE 0.9 % IV SOLN: INTRAVENOUS | Status: DC

## 2020-02-03 MED ORDER — HEPARIN SODIUM (PORCINE) 5000 UNIT/ML IJ SOLN
5000.0000 [IU] | Freq: Three times a day (TID) | INTRAMUSCULAR | Status: AC
  Administered 2020-02-03: 5000 [IU] via SUBCUTANEOUS
  Filled 2020-02-03 (×2): qty 1

## 2020-02-03 MED ORDER — METRONIDAZOLE IN NACL 5-0.79 MG/ML-% IV SOLN
500.0000 mg | Freq: Three times a day (TID) | INTRAVENOUS | Status: DC
  Administered 2020-02-03 – 2020-02-06 (×8): 500 mg via INTRAVENOUS
  Filled 2020-02-03 (×8): qty 100

## 2020-02-03 MED ORDER — ALUM & MAG HYDROXIDE-SIMETH 200-200-20 MG/5ML PO SUSP
30.0000 mL | Freq: Once | ORAL | Status: AC
  Administered 2020-02-03: 30 mL via ORAL

## 2020-02-03 MED ORDER — ACETAMINOPHEN 325 MG PO TABS
650.0000 mg | ORAL_TABLET | Freq: Four times a day (QID) | ORAL | Status: DC | PRN
  Administered 2020-02-03: 650 mg via ORAL
  Filled 2020-02-03: qty 2

## 2020-02-03 MED ORDER — POTASSIUM CHLORIDE 10 MEQ/100ML IV SOLN
10.0000 meq | INTRAVENOUS | Status: AC
  Administered 2020-02-03 (×4): 10 meq via INTRAVENOUS
  Filled 2020-02-03 (×4): qty 100

## 2020-02-03 MED ORDER — ONDANSETRON HCL 4 MG/2ML IJ SOLN
4.0000 mg | Freq: Once | INTRAMUSCULAR | Status: AC
  Administered 2020-02-03: 4 mg via INTRAVENOUS
  Filled 2020-02-03: qty 2

## 2020-02-03 MED ORDER — METOPROLOL TARTRATE 5 MG/5ML IV SOLN
5.0000 mg | Freq: Four times a day (QID) | INTRAVENOUS | Status: DC | PRN
  Administered 2020-02-04: 5 mg via INTRAVENOUS
  Filled 2020-02-03: qty 5

## 2020-02-03 MED ORDER — ALUM & MAG HYDROXIDE-SIMETH 200-200-20 MG/5ML PO SUSP
30.0000 mL | ORAL | Status: DC | PRN
  Administered 2020-02-03 – 2020-02-06 (×2): 30 mL via ORAL
  Filled 2020-02-03: qty 30

## 2020-02-03 MED ORDER — ALUM & MAG HYDROXIDE-SIMETH 200-200-20 MG/5ML PO SUSP
30.0000 mL | Freq: Once | ORAL | Status: DC
  Filled 2020-02-03 (×2): qty 30

## 2020-02-03 MED ORDER — LIDOCAINE VISCOUS HCL 2 % MT SOLN
15.0000 mL | Freq: Once | OROMUCOSAL | Status: AC
  Administered 2020-02-03: 15 mL via ORAL
  Filled 2020-02-03: qty 15

## 2020-02-03 MED ORDER — IOHEXOL 350 MG/ML SOLN
100.0000 mL | Freq: Once | INTRAVENOUS | Status: AC | PRN
  Administered 2020-02-03: 100 mL via INTRAVENOUS

## 2020-02-03 MED ORDER — ALUM & MAG HYDROXIDE-SIMETH 200-200-20 MG/5ML PO SUSP
30.0000 mL | Freq: Once | ORAL | Status: AC
  Administered 2020-02-03: 30 mL via ORAL
  Filled 2020-02-03: qty 30

## 2020-02-03 MED ORDER — FENTANYL CITRATE (PF) 100 MCG/2ML IJ SOLN
50.0000 ug | Freq: Once | INTRAMUSCULAR | Status: AC
  Administered 2020-02-03: 50 ug via INTRAVENOUS
  Filled 2020-02-03: qty 2

## 2020-02-03 MED ORDER — SALINE SPRAY 0.65 % NA SOLN
1.0000 | NASAL | Status: DC | PRN
  Administered 2020-02-03: 1 via NASAL
  Filled 2020-02-03: qty 44

## 2020-02-03 MED ORDER — ACETAMINOPHEN 650 MG RE SUPP
650.0000 mg | Freq: Four times a day (QID) | RECTAL | Status: DC | PRN
  Administered 2020-02-04: 650 mg via RECTAL
  Filled 2020-02-03: qty 1

## 2020-02-03 NOTE — Consult Note (Signed)
[facility] Surgery Consult  Note  Vickie Chang 30-Mar-1946  [id number].    Requesting MD:  April Palumbo Chief Complaint: Chest pain, nausea, vomiting, unable to sleep last p.m. Reason for Consult: Gallstone pancreatitis  Patient is a 74 year old female who presented to [facility] with the above-noted complaints. She was found to have some abdominal tenderness in the midepigastric area guarding and a positive Murphy sign.  Patient reports pain started yesterday around 3 PM.  She thought she was having a heart attack.  She subsequently went to the ED somewhere between 4 and 5 PM.  Work-up in the ED shows she is afebrile but tachycardic with blood pressure elevated. Labs shows a potassium of 3.1, glucose of 153, creatinine 0.98. Lipase of 81 AST of 181, ALT 94, total bilirubin 1.2. WBC 17.9, hemoglobin 13.2, hematocrit 38.4, platelets 246,000. Covid is negative. Chest x-ray is unremarkable. CT of the head showed no acute findings, extensive chronic small vessel ischemia in the cerebral white matter. CT angio showed no evidence of acute aortic syndrome or aneurysm. No acute intrathoracic or intra-abdominal findings. Cholelithiasis. Pancreas was unremarkable with no pancreatic duct dilatation or surrounding inflammatory changes. Mild sigmoid diverticulosis, no diverticulitis. A diagnosis of gallstone pancreatitis was made she has been transferred to [facility] for medicine admission and evaluation by our service for gallstone pancreatitis. Abdominal ultrasound shows there is layering 1.5 cm gallstone and a mildly distended gallbladder. No gallbladder wall thickening no pericholecystic fluid common bile duct is 6 mm.. Findings consistent with cholelithiasis but no evidence of acute cholecystitis. Nonspecific mildly echogenic liver parenchyma, possible hepatic steatosis or fibrosis.  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 Type 2 diabetes, hypertension, history of anxiety/depression, hypothyroidism, GERD, Hx asthma, hx legionnaires disease 1984.  We are asked to see.  ROS: Review of Systems  Constitutional: Negative.   HENT: Negative.   Eyes:       Blind right eye  Respiratory: Positive for shortness of breath (She gets a little short of breath with some exertion, a lot of it sounds like she is just tired with being active for time). Negative for cough, hemoptysis, sputum production and wheezing.   Cardiovascular: Positive for chest pain (Was really midepigastric pain she also had pain right upper quadrant.) and leg swelling (Occasional when she is standing for long periods). Negative for palpitations, orthopnea, claudication and PND.       She does not really have claudication but she says she gets tired with ambulation.  She is good for about 1 hour.  Gastrointestinal: Positive for abdominal pain (She has some midepigastric pain on admission and she has some tenderness in the right upper quadrant on palpation.) and heartburn. Negative for blood in stool, constipation, diarrhea, melena, nausea and vomiting.       She complains of intermittent belching which leads to abdominal discomfort some nausea and vomiting.  She has some GERD with the symptoms.  She says this can come on at any time.  She is not sure its associated with oral intake.  She has never had a colonoscopy  Genitourinary: Negative.   Musculoskeletal: Negative.   Skin: Negative.   Neurological: Negative.   Endo/Heme/Allergies: Bruises/bleeds easily.  Psychiatric/Behavioral: Negative.     Family History  Problem Relation Age of Onset  . Heart attack Father        died in  his 16's of MI  . Stroke Father   . Asthma Sister     Past Medical History:  Diagnosis Date  . Anxiety   . Central retinal artery occlusion, right eye    a. Dx 2016 - admitted with transient visual  disturbance, had recently been dx with central retinal artery occlusion.  . Cough   . Depression   . Diet-controlled diabetes mellitus ([place])   . GERD (gastroesophageal reflux disease)   . Hyperlipidemia   . Hypertension   . Hypokalemia   . Hypothyroidism   . Intrinsic asthma   . Legionnaire's disease ([place])    1984    Past Surgical History:  Procedure Laterality Date  . TEE WITHOUT CARDIOVERSION N/A 05/26/2015   Procedure: TRANSESOPHAGEAL ECHOCARDIOGRAM (TEE);  Surgeon: Larey Dresser, MD;  Location: [facility];  Service: Cardiovascular;  Laterality: N/A;  . TOTAL ABDOMINAL HYSTERECTOMY W/ BILATERAL SALPINGOOPHORECTOMY  06/27/15   and excision of umbilical hernia sac, performed by Dr. Fermin Schwab at [facility] History:  reports that she has never smoked. She has never used smokeless tobacco. She reports that she does not drink alcohol or use drugs. Tobacco: None EtOH: None Drugs: None Allergies:  Allergies  Allergen Reactions  . Sulfa Antibiotics Hives  . Codeine Other (See Comments)    gi upset  . Penicillins Swelling  . Sulfonamide Derivatives Hives    Prior to Admission medications   Medication Sig Start Date End Date Taking? Authorizing Provider  acetaminophen (TYLENOL) 325 MG tablet Take 650 mg by mouth every 6 (six) hours as needed.    [provider]  ALPRAZolam Duanne Moron) 0.25 MG tablet Take 0.25 mg by mouth every 12 (twelve) hours as needed.    [provider]  aspirin EC 325 MG tablet Take 1 tablet (325 mg total) by mouth daily. Patient taking differently: Take 81 mg by mouth daily.  04/28/15   Orson Eva, MD  atorvastatin (LIPITOR) 20 MG tablet Take 1 tablet (20 mg total) by mouth daily at 6 PM. 04/28/15   Tat, Shanon Brow, MD  buPROPion (ZYBAN) 150 MG 12 hr tablet Take 450 mg by mouth daily.    [provider]  clotrimazole-betamethasone (LOTRISONE) cream APP AA BID 05/20/15   [provider]  dexlansoprazole (DEXILANT)  60 MG capsule Take 60 mg by mouth daily.    [provider]  dextromethorphan-guaiFENesin (MUCINEX DM) 30-600 MG per 12 hr tablet Take 1 tablet by mouth every 12 (twelve) hours.    [provider]  Eszopiclone (ESZOPICLONE) 3 MG TABS Take 3 mg by mouth at bedtime. Take immediately before bedtime    [provider]  famotidine (PEPCID) 20 MG tablet Take 20 mg by mouth 2 (two) times daily.     [provider]  finasteride (PROSCAR) 5 MG tablet Take 2.5 mg by mouth daily.  05/20/15   [provider]  [place] HFA 220 MCG/ACT inhaler INHALE 1 PUFF DAILY 05/20/15   [provider]  hydrochlorothiazide (HYDRODIURIL) 25 MG tablet TK 1 T PO QAM 06/18/15   [provider]  Hydrocod Polst-Chlorphen Polst 10-8 MG CP12 Take by mouth.    [provider]  latanoprost (XALATAN) 0.005 % ophthalmic solution INSTILL ONE DROP IN OU QHS 06/18/15   [provider]  levothyroxine (SYNTHROID, LEVOTHROID) 100 MCG tablet Take 100 mcg by mouth daily.    [provider]  losartan (COZAAR) 100 MG tablet Take 1 tablet (100 mg total) by  mouth daily. 04/28/15   Orson Eva, MD  meloxicam (MOBIC) 7.5 MG tablet TK 2 TS PO QD 06/18/15   [provider]  Multiple Vitamins-Minerals (CENTRUM SILVER ULTRA WOMENS PO) Take 1 tablet by mouth daily.    [provider]  nebivolol (BYSTOLIC) 5 MG tablet Take 1 tablet (5 mg total) by mouth daily. 04/28/15   Orson Eva, MD  ondansetron (ZOFRAN-ODT) 4 MG disintegrating tablet TK 1 T PO Q 6 H PRN FOR UP TO 7 DAYS. 06/18/15   [provider]  potassium chloride SA (K-DUR,KLOR-CON) 20 MEQ tablet Take 60 mEq by mouth daily.    [provider]  traMADol (ULTRAM) 50 MG tablet 1 -2 every 4 hrs. As needed    [provider]     Blood pressure (!) 160/83, pulse (!) 108, temperature (!) 97.5 F (36.4 C), temperature source Oral, resp. rate 20, height 5' (1.524 m), weight 65 kg,  SpO2 98 %. Physical Exam:  General: pleasant, WD, WN white female who is laying in bed in NAD.  Pain has resolved. HEENT: head is normocephalic, atraumatic.  Sclera are noninjected.   Pupils are equal. ears and nose without any masses or lesions.  Mouth is pink and moist Heart: regular, rate, and rhythm.  Normal s1,s2. No obvious murmurs, gallops, or rubs noted.  Palpable radial and pedal pulses bilaterally Lungs: CTAB, no wheezes, rhonchi, or rales noted.  Respiratory effort nonlabored Abd: soft, NT, ND, +BS, no masses, hernias, or organomegaly.  She is slightly tender in the right upper quadrant on palpation.  Mid abdominal scar below the umbilicus from her hysterectomy 2016. MS: all 4 extremities are symmetrical with no cyanosis, clubbing, or edema. Skin: warm and dry with no masses, lesions, or rashes Neuro: Cranial nerves 2-12 grossly intact, sensation is normal throughout Psych: A&Ox3 with an appropriate affect.   Results for orders placed or performed during the hospital encounter of 02/03/20 (from the past 48 hour(s))  CBC with Differential/Platelet     Status: Abnormal   Collection Time: 02/03/20  5:12 AM  Result Value Ref Range   WBC 17.9 (H) 4.0 - 10.5 K/uL   RBC 4.55 3.87 - 5.11 MIL/uL   Hemoglobin 13.2 12.0 - 15.0 g/dL   HCT 38.4 36.0 - 46.0 %   MCV 84.4 80.0 - 100.0 fL   MCH 29.0 26.0 - 34.0 pg   MCHC 34.4 30.0 - 36.0 g/dL   RDW 13.4 11.5 - 15.5 %   Platelets 246 150 - 400 K/uL   nRBC 0.0 0.0 - 0.2 %   Neutrophils Relative % 94 %   Neutro Abs 16.9 (H) 1.7 - 7.7 K/uL   Lymphocytes Relative 4 %   Lymphs Abs 0.6 (L) 0.7 - 4.0 K/uL   Monocytes Relative 1 %   Monocytes Absolute 0.1 0.1 - 1.0 K/uL   Eosinophils Relative 1 %   Eosinophils Absolute 0.1 0.0 - 0.5 K/uL   Basophils Relative 0 %   Basophils Absolute 0.0 0.0 - 0.1 K/uL   Immature Granulocytes 0 %   Abs Immature Granulocytes 0.06 0.00 - 0.07 K/uL    Comment: Performed at [facility], [address] [place], [place] [address]  Troponin I (High Sensitivity)     Status: None   Collection Time: 02/03/20  5:13 AM  Result Value Ref Range   Troponin I (High Sensitivity) 5 <18 ng/L    Comment: (NOTE) Elevated high sensitivity troponin I (hsTnI) values and  significant  changes across serial measurements may suggest ACS but many other  chronic and acute conditions are known to elevate hsTnI results.  Refer to the "Links" section for chest pain algorithms and additional  guidance. Performed at [facility], [address] [place], [place] [address]   Comprehensive metabolic panel     Status: Abnormal   Collection Time: 02/03/20  5:13 AM  Result Value Ref Range   Sodium 141 135 - 145 mmol/L   Potassium 3.1 (L) 3.5 - 5.1 mmol/L   Chloride 103 98 - 111 mmol/L   CO2 27 22 - 32 mmol/L   Glucose, Bld 153 (H) 70 - 99 mg/dL    Comment: Glucose reference range applies only to samples taken after fasting for at least 8 hours.   BUN 11 8 - 23 mg/dL   Creatinine, Ser 0.98 0.44 - 1.00 mg/dL   Calcium 8.9 8.9 - 10.3 mg/dL   Total Protein 6.9 6.5 - 8.1 g/dL   Albumin 4.0 3.5 - 5.0 g/dL   AST 181 (H) 15 - 41 U/L   ALT 94 (H) 0 - 44 U/L   Alkaline Phosphatase 110 38 - 126 U/L   Total Bilirubin 1.2 0.3 - 1.2 mg/dL   GFR calc non Af Amer 58 (L) >60 mL/min   GFR calc Af Amer >60 >60 mL/min   Anion gap 11 5 - 15    Comment: Performed at [facility], [address] [place], [place] [address]  Lipase, blood     Status: Abnormal   Collection Time: 02/03/20  5:13 AM  Result Value Ref Range   Lipase 81 (H) 11 - 51 U/L    Comment: Performed at [facility], [address] [place], [place] [address]  SARS Coronavirus 2 by RT PCR (hospital order, performed in [facility] hospital lab) Nasopharyngeal Nasopharyngeal Swab     Status: None   Collection Time: 02/03/20  5:13 AM   Specimen: Nasopharyngeal Swab  Result Value Ref Range   SARS Coronavirus 2 NEGATIVE  NEGATIVE    Comment: (NOTE) SARS-CoV-2 target nucleic acids are NOT DETECTED. The SARS-CoV-2 RNA is generally detectable in upper and lower respiratory specimens during the acute phase of infection. The lowest concentration of SARS-CoV-2 viral copies this assay can detect is 250 copies / mL. A negative result does not preclude SARS-CoV-2 infection and should not be used as the sole basis for treatment or other patient management decisions.  A negative result may occur with improper specimen collection / handling, submission of specimen other than nasopharyngeal swab, presence of viral mutation(s) within the areas targeted by this assay, and inadequate number of viral copies (<250 copies / mL). A negative result must be combined with clinical observations, patient history, and epidemiological information. Fact Sheet for Patients:   StrictlyIdeas.no Fact Sheet for Healthcare Providers: BankingDealers.co.za This test is not yet approved or cleared  by the [place] FDA and has been authorized for detection and/or diagnosis of SARS-CoV-2 by FDA under an Emergency Use Authorization (EUA).  This EUA will remain in effect (meaning this test can be used) for the duration of the COVID-19 declaration under Section 564(b)(1) of the Act, 21 U.S.C. section 360bbb-3(b)(1), unless the authorization is terminated or revoked sooner. Performed at [facility], [address] [place], [place] [address]   Troponin I (High Sensitivity)     Status: None   Collection Time: 02/03/20  8:40  AM  Result Value Ref Range   Troponin I (High Sensitivity) 7 <18 ng/L    Comment: (NOTE) Elevated high sensitivity troponin I (hsTnI) values and significant  changes across serial measurements may suggest ACS but many other  chronic and acute conditions are known to elevate hsTnI results.  Refer to the "Links" section for chest pain algorithms and additional   guidance. Performed at [facility], [address], [place], [place] [address]    CT Head Wo Contrast  Result Date: 02/03/2020 CLINICAL DATA:  Vomiting and dizziness.  Frontal headache EXAM: CT HEAD WITHOUT CONTRAST TECHNIQUE: Contiguous axial images were obtained from the base of the skull through the vertex without intravenous contrast. COMPARISON:  06/03/2015 FINDINGS: Brain: No evidence of acute infarction, hemorrhage, hydrocephalus, extra-axial collection or mass lesion/mass effect. 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 Vascular: Atherosclerotic calcification. Skull: Normal. Negative for fracture or focal lesion. Sinuses/Orbits: Bilateral cataract resection.  No acute finding. IMPRESSION: 1. No acute finding. 2. Extensive chronic small vessel ischemia in the cerebral white matter. Electronically Signed   By: Monte Fantasia M.D.   On: 02/03/2020 06:08   DG Chest Portable 1 View  Result Date: 02/03/2020 CLINICAL DATA:  Shortness of breath EXAM: PORTABLE CHEST 1 VIEW COMPARISON:  06/03/2015 FINDINGS: Better inflated than before. There is no edema, consolidation, effusion, or pneumothorax. Normal heart size and mediastinal contours. Artifact from EKG leads. IMPRESSION: Negative. Electronically Signed   By: Monte Fantasia M.D.   On: 02/03/2020 05:50   CT Angio Chest/Abd/Pel for Dissection W and/or Wo Contrast  Result Date: 02/03/2020 CLINICAL DATA:  Chest pain with nausea and vomiting. EXAM: CT ANGIOGRAPHY CHEST, ABDOMEN AND PELVIS TECHNIQUE: Non-contrast CT of the chest was initially obtained. Multidetector CT imaging through the chest, abdomen and pelvis was performed using the standard protocol during bolus administration of intravenous contrast. Multiplanar reconstructed images and MIPs were obtained and reviewed to evaluate the vascular anatomy. CONTRAST:  176m OMNIPAQUE IOHEXOL 350 MG/ML SOLN COMPARISON:  Chest x-ray from same day. CT chest,  abdomen, and pelvis dated June 03, 2015. FINDINGS: CTA CHEST FINDINGS Cardiovascular: Preferential opacification of the thoracic aorta. No evidence of thoracic aortic aneurysm or dissection. Mild atherosclerotic calcification of the aortic arch and great vessels. Normal heart size. Chronic trace pericardial effusion. No central pulmonary embolism. Mediastinum/Nodes: No enlarged mediastinal, hilar, or axillary lymph nodes. Unchanged 8 mm hypodense nodule small calcifications in the right thyroid lobe, stable since 2016. Not clinically significant; no follow-up imaging recommended. Trachea and esophagus demonstrate no significant findings. Lungs/Pleura: Lungs are clear. No pleural effusion or pneumothorax. Musculoskeletal: No chest wall abnormality. No acute or significant osseous findings. Chronic mild T10 and T12 superior endplate compression deformities. Review of the MIP images confirms the above findings. CTA ABDOMEN AND PELVIS FINDINGS VASCULAR Aorta: Normal caliber aorta without aneurysm, dissection, vasculitis or significant stenosis. Mild atherosclerotic calcification. Celiac: Patent without evidence of aneurysm, dissection, vasculitis or significant stenosis. SMA: Patent without evidence of aneurysm, dissection, vasculitis or significant stenosis. Renals: Both renal arteries are patent without evidence of aneurysm, dissection, vasculitis, fibromuscular dysplasia or significant stenosis. IMA: Patent without evidence of aneurysm, dissection, vasculitis or significant stenosis. Inflow: Patent without evidence of aneurysm, dissection, vasculitis or significant stenosis. Veins: No obvious venous abnormality within the limitations of this arterial phase study. Review of the MIP images confirms the above findings. NON-VASCULAR Hepatobiliary: No focal liver abnormality is seen. Small gallstones. No gallbladder wall thickening. The common  bile duct measures 7 mm, at the upper limits of normal. Pancreas:  Unremarkable. No pancreatic ductal dilatation or surrounding inflammatory changes. Spleen: Normal in size without focal abnormality. Adrenals/Urinary Tract: Adrenal glands are unremarkable. Kidneys are normal, without renal calculi, focal lesion, or hydronephrosis. Bladder is unremarkable. Stomach/Bowel: Small hiatal hernia. The stomach is otherwise within normal limits. No bowel wall thickening, distention, or surrounding inflammatory changes. Mild sigmoid diverticulosis. Normal appendix. Lymphatic: No enlarged abdominal or pelvic lymph nodes. Reproductive: Status post hysterectomy. No adnexal masses. Other: No abdominal wall hernia or abnormality. No abdominopelvic ascites. No pneumoperitoneum. Musculoskeletal: No acute or significant osseous findings. Review of the MIP images confirms the above findings. IMPRESSION: 1. No evidence of acute aortic syndrome or aneurysm. 2. No acute intrathoracic or intra-abdominal findings. 3. Cholelithiasis. 4. Aortic Atherosclerosis (ICD10-I70.0). Electronically Signed   By: Titus Dubin M.D.   On: 02/03/2020 06:44      Assessment/Plan Hx mucinous adenocarcinoma of the uterus 2016 Hx central retinal artery occlusion 2016 Type 2 diabetes Hypertension Hypothyroid Hx asthma Hx GERD she had a 1.5 cm gallstone Hx anxiety/depression Hx Legionnaires' disease 1984  Gallstone pancreatitis Cholelithiasis/cholecystitis  FEN: IV fluids/sips of clears tonight/n.p.o. after midnight ID: Cipro/Flagyl 5/13>> DVT: Subcu heparin tonight  Plan: We will let her have sips and chips tonight.  Recheck her labs in the morning.  Her labs and her pancreatitis appear resolved will consider laparoscopic cholecystectomy tomorrow.    Earnstine Regal [facility] Surgery 02/03/2020, 10:16 AM Please see Amion for pager number during day hours 7:00am-4:30pm

## 2020-02-03 NOTE — ED Notes (Signed)
Patient is resting comfortably, denies pain, N/V at this time. Pt requested and was provided an additional pillow. Pt instructed to use call bell if any reaction to medications currently being administered via IV

## 2020-02-03 NOTE — ED Triage Notes (Signed)
Patient presents with complaints of chest pain onset 0330 this am; states was unable to sleep; with NV

## 2020-02-03 NOTE — ED Notes (Signed)
ED Provider Plunkett at bedside.

## 2020-02-03 NOTE — ED Notes (Signed)
Delta trop delay r/t medications being administered

## 2020-02-03 NOTE — ED Notes (Addendum)
US at bedside

## 2020-02-03 NOTE — H&P (View-Only) (Signed)
[facility] Consult  Note  Vickie Chang 01/20/1946  [id number].    Requesting MD:  April Palumbo Chief Complaint: Chest pain, nausea, vomiting, unable to sleep last p.m. Reason for Consult: Gallstone pancreatitis  Patient is a 74-year-old female who presented to [facility] [place] with the above-noted complaints. She was found to have some abdominal tenderness in the midepigastric area guarding and a positive Murphy sign.  Patient reports pain started yesterday around 3 PM.  She thought she was having a heart attack.  She subsequently went to the ED somewhere between 4 and 5 PM.  Work-up in the ED shows she is afebrile but tachycardic with blood pressure elevated. Labs shows a potassium of 3.1, glucose of 153, creatinine 0.98. Lipase of 81 AST of 181, ALT 94, total bilirubin 1.2. WBC 17.9, hemoglobin 13.2, hematocrit 38.4, platelets 246,000. Covid is negative. Chest x-ray is unremarkable. CT of the head showed no acute findings, extensive chronic small vessel ischemia in the cerebral white matter. CT angio showed no evidence of acute aortic syndrome or aneurysm. No acute intrathoracic or intra-abdominal findings. Cholelithiasis. Pancreas was unremarkable with no pancreatic duct dilatation or surrounding inflammatory changes. Mild sigmoid diverticulosis, no diverticulitis. A diagnosis of gallstone pancreatitis was made she has been transferred to [facility] for medicine admission and evaluation by our service for gallstone pancreatitis. Abdominal ultrasound shows there is layering 1.5 cm gallstone and a mildly distended gallbladder. No gallbladder wall thickening no pericholecystic fluid common bile duct is 6 mm.. Findings consistent with cholelithiasis but no evidence of acute cholecystitis. Nonspecific mildly echogenic liver parenchyma, possible hepatic steatosis or fibrosis.  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 Type 2 diabetes, hypertension, history of anxiety/depression, hypothyroidism, GERD, Hx asthma, hx legionnaires disease 1984.  We are asked to see.  ROS: Review of Systems  Constitutional: Negative.   HENT: Negative.   Eyes:       Blind right eye  Respiratory: Positive for shortness of breath (She gets a little short of breath with some exertion, a lot of it sounds like she is just tired with being active for time). Negative for cough, hemoptysis, sputum production and wheezing.   Cardiovascular: Positive for chest pain (Was really midepigastric pain she also had pain right upper quadrant.) and leg swelling (Occasional when she is standing for long periods). Negative for palpitations, orthopnea, claudication and PND.       She does not really have claudication but she says she gets tired with ambulation.  She is good for about 1 hour.  Gastrointestinal: Positive for abdominal pain (She has some midepigastric pain on admission and she has some tenderness in the right upper quadrant on palpation.) and heartburn. Negative for blood in stool, constipation, diarrhea, melena, nausea and vomiting.       She complains of intermittent belching which leads to abdominal discomfort some nausea and vomiting.  She has some GERD with the symptoms.  She says this can come on at any time.  She is not sure its associated with oral intake.  She has never had a colonoscopy  Genitourinary: Negative.   Musculoskeletal: Negative.   Skin: Negative.   Neurological: Negative.   Endo/Heme/Allergies: Bruises/bleeds easily.  Psychiatric/Behavioral: Negative.     Family History  Problem Relation Age of Onset  . Heart attack Father        died in   his 50's of MI  . Stroke Father   . Asthma Sister     Past Medical History:  Diagnosis Date  . Anxiety   . Central retinal artery occlusion, right eye    a. Dx 2016 - admitted with transient visual  disturbance, had recently been dx with central retinal artery occlusion.  . Cough   . Depression   . Diet-controlled diabetes mellitus (HCC)   . GERD (gastroesophageal reflux disease)   . Hyperlipidemia   . Hypertension   . Hypokalemia   . Hypothyroidism   . Intrinsic asthma   . Legionnaire's disease (HCC)    1984    Past Surgical History:  Procedure Laterality Date  . TEE WITHOUT CARDIOVERSION N/A 05/26/2015   Procedure: TRANSESOPHAGEAL ECHOCARDIOGRAM (TEE);  Surgeon: Dalton S McLean, MD;  Location: [facility];  Service: Cardiovascular;  Laterality: N/A;  . TOTAL ABDOMINAL HYSTERECTOMY W/ BILATERAL SALPINGOOPHORECTOMY  06/27/15   and excision of umbilical hernia sac, performed by Dr. Clarke-Pearson at [facility]    Social History:  reports that she has never smoked. She has never used smokeless tobacco. She reports that she does not drink alcohol or use drugs. Tobacco: None EtOH: None Drugs: None Allergies:  Allergies  Allergen Reactions  . Sulfa Antibiotics Hives  . Codeine Other (See Comments)    gi upset  . Penicillins Swelling  . Sulfonamide Derivatives Hives    Prior to Admission medications   Medication Sig Start Date End Date Taking? Authorizing Provider  acetaminophen (TYLENOL) 325 MG tablet Take 650 mg by mouth every 6 (six) hours as needed.    [provider]  ALPRAZolam (XANAX) 0.25 MG tablet Take 0.25 mg by mouth every 12 (twelve) hours as needed.    [provider]  aspirin EC 325 MG tablet Take 1 tablet (325 mg total) by mouth daily. Patient taking differently: Take 81 mg by mouth daily.  04/28/15   Tat, David, MD  atorvastatin (LIPITOR) 20 MG tablet Take 1 tablet (20 mg total) by mouth daily at 6 PM. 04/28/15   Tat, David, MD  buPROPion (ZYBAN) 150 MG 12 hr tablet Take 450 mg by mouth daily.    [provider]  clotrimazole-betamethasone (LOTRISONE) cream APP AA BID 05/20/15   [provider]  dexlansoprazole (DEXILANT)  60 MG capsule Take 60 mg by mouth daily.    [provider]  dextromethorphan-guaiFENesin (MUCINEX DM) 30-600 MG per 12 hr tablet Take 1 tablet by mouth every 12 (twelve) hours.    [provider]  Eszopiclone (ESZOPICLONE) 3 MG TABS Take 3 mg by mouth at bedtime. Take immediately before bedtime    [provider]  famotidine (PEPCID) 20 MG tablet Take 20 mg by mouth 2 (two) times daily.     [provider]  finasteride (PROSCAR) 5 MG tablet Take 2.5 mg by mouth daily.  05/20/15   [provider]  FLOVENT HFA 220 MCG/ACT inhaler INHALE 1 PUFF DAILY 05/20/15   [provider]  hydrochlorothiazide (HYDRODIURIL) 25 MG tablet TK 1 T PO QAM 06/18/15   [provider]  Hydrocod Polst-Chlorphen Polst 10-8 MG CP12 Take by mouth.    [provider]  latanoprost (XALATAN) 0.005 % ophthalmic solution INSTILL ONE DROP IN OU QHS 06/18/15   [provider]  levothyroxine (SYNTHROID, LEVOTHROID) 100 MCG tablet Take 100 mcg by mouth daily.    [provider]  losartan (COZAAR) 100 MG tablet Take 1 tablet (100 mg total) by   mouth daily. 04/28/15   Tat, David, MD  meloxicam (MOBIC) 7.5 MG tablet TK 2 TS PO QD 06/18/15   [provider]  Multiple Vitamins-Minerals (CENTRUM SILVER ULTRA WOMENS PO) Take 1 tablet by mouth daily.    [provider]  nebivolol (BYSTOLIC) 5 MG tablet Take 1 tablet (5 mg total) by mouth daily. 04/28/15   Tat, David, MD  ondansetron (ZOFRAN-ODT) 4 MG disintegrating tablet TK 1 T PO Q 6 H PRN FOR UP TO 7 DAYS. 06/18/15   [provider]  potassium chloride SA (K-DUR,KLOR-CON) 20 MEQ tablet Take 60 mEq by mouth daily.    [provider]  traMADol (ULTRAM) 50 MG tablet 1 -2 every 4 hrs. As needed    [provider]     Blood pressure (!) 160/83, pulse (!) 108, temperature (!) 97.5 F (36.4 C), temperature source Oral, resp. rate 20, height 5' (1.524 m), weight 65 kg,  SpO2 98 %. Physical Exam:  General: pleasant, WD, WN white female who is laying in bed in NAD.  Pain has resolved. HEENT: head is normocephalic, atraumatic.  Sclera are noninjected.   Pupils are equal. ears and nose without any masses or lesions.  Mouth is pink and moist Heart: regular, rate, and rhythm.  Normal s1,s2. No obvious murmurs, gallops, or rubs noted.  Palpable radial and pedal pulses bilaterally Lungs: CTAB, no wheezes, rhonchi, or rales noted.  Respiratory effort nonlabored Abd: soft, NT, ND, +BS, no masses, hernias, or organomegaly.  She is slightly tender in the right upper quadrant on palpation.  Mid abdominal scar below the umbilicus from her hysterectomy 2016. MS: all 4 extremities are symmetrical with no cyanosis, clubbing, or edema. Skin: warm and dry with no masses, lesions, or rashes Neuro: Cranial nerves 2-12 grossly intact, sensation is normal throughout Psych: A&Ox3 with an appropriate affect.   Results for orders placed or performed during the hospital encounter of 02/03/20 (from the past 48 hour(s))  CBC with Differential/Platelet     Status: Abnormal   Collection Time: 02/03/20  5:12 AM  Result Value Ref Range   WBC 17.9 (H) 4.0 - 10.5 K/uL   RBC 4.55 3.87 - 5.11 MIL/uL   Hemoglobin 13.2 12.0 - 15.0 g/dL   HCT 38.4 36.0 - 46.0 %   MCV 84.4 80.0 - 100.0 fL   MCH 29.0 26.0 - 34.0 pg   MCHC 34.4 30.0 - 36.0 g/dL   RDW 13.4 11.5 - 15.5 %   Platelets 246 150 - 400 K/uL   nRBC 0.0 0.0 - 0.2 %   Neutrophils Relative % 94 %   Neutro Abs 16.9 (H) 1.7 - 7.7 K/uL   Lymphocytes Relative 4 %   Lymphs Abs 0.6 (L) 0.7 - 4.0 K/uL   Monocytes Relative 1 %   Monocytes Absolute 0.1 0.1 - 1.0 K/uL   Eosinophils Relative 1 %   Eosinophils Absolute 0.1 0.0 - 0.5 K/uL   Basophils Relative 0 %   Basophils Absolute 0.0 0.0 - 0.1 K/uL   Immature Granulocytes 0 %   Abs Immature Granulocytes 0.06 0.00 - 0.07 K/uL    Comment: Performed at [facility] [place], [address] [place], [place] [address]  Troponin I (High Sensitivity)     Status: None   Collection Time: 02/03/20  5:13 AM  Result Value Ref Range   Troponin I (High Sensitivity) 5 <18 ng/L    Comment: (NOTE) Elevated high sensitivity troponin I (hsTnI) values and   significant  changes across serial measurements may suggest ACS but many other  chronic and acute conditions are known to elevate hsTnI results.  Refer to the "Links" section for chest pain algorithms and additional  guidance. Performed at [facility] [place], [address] [place], [place] [address]   Comprehensive metabolic panel     Status: Abnormal   Collection Time: 02/03/20  5:13 AM  Result Value Ref Range   Sodium 141 135 - 145 mmol/L   Potassium 3.1 (L) 3.5 - 5.1 mmol/L   Chloride 103 98 - 111 mmol/L   CO2 27 22 - 32 mmol/L   Glucose, Bld 153 (H) 70 - 99 mg/dL    Comment: Glucose reference range applies only to samples taken after fasting for at least 8 hours.   BUN 11 8 - 23 mg/dL   Creatinine, Ser 0.98 0.44 - 1.00 mg/dL   Calcium 8.9 8.9 - 10.3 mg/dL   Total Protein 6.9 6.5 - 8.1 g/dL   Albumin 4.0 3.5 - 5.0 g/dL   AST 181 (H) 15 - 41 U/L   ALT 94 (H) 0 - 44 U/L   Alkaline Phosphatase 110 38 - 126 U/L   Total Bilirubin 1.2 0.3 - 1.2 mg/dL   GFR calc non Af Amer 58 (L) >60 mL/min   GFR calc Af Amer >60 >60 mL/min   Anion gap 11 5 - 15    Comment: Performed at [facility] [place], [address] [place], [place] [address]  Lipase, blood     Status: Abnormal   Collection Time: 02/03/20  5:13 AM  Result Value Ref Range   Lipase 81 (H) 11 - 51 U/L    Comment: Performed at [facility] [place], [address] [place], [place] [address]  SARS Coronavirus 2 by RT PCR (hospital order, performed in [facility] hospital lab) Nasopharyngeal Nasopharyngeal Swab     Status: None   Collection Time: 02/03/20  5:13 AM   Specimen: Nasopharyngeal Swab  Result Value Ref Range   SARS Coronavirus 2 NEGATIVE  NEGATIVE    Comment: (NOTE) SARS-CoV-2 target nucleic acids are NOT DETECTED. The SARS-CoV-2 RNA is generally detectable in upper and lower respiratory specimens during the acute phase of infection. The lowest concentration of SARS-CoV-2 viral copies this assay can detect is 250 copies / mL. A negative result does not preclude SARS-CoV-2 infection and should not be used as the sole basis for treatment or other patient management decisions.  A negative result may occur with improper specimen collection / handling, submission of specimen other than nasopharyngeal swab, presence of viral mutation(s) within the areas targeted by this assay, and inadequate number of viral copies (<250 copies / mL). A negative result must be combined with clinical observations, patient history, and epidemiological information. Fact Sheet for Patients:   https://www.fda.gov/media/136312/download Fact Sheet for Healthcare Providers: https://www.fda.gov/media/136313/download This test is not yet approved or cleared  by the [place] FDA and has been authorized for detection and/or diagnosis of SARS-CoV-2 by FDA under an Emergency Use Authorization (EUA).  This EUA will remain in effect (meaning this test can be used) for the duration of the COVID-19 declaration under Section 564(b)(1) of the Act, 21 U.S.C. section 360bbb-3(b)(1), unless the authorization is terminated or revoked sooner. Performed at [facility] [place], [address] [place],  [address]   Troponin I (High Sensitivity)     Status: None   Collection Time: 02/03/20  8:40   AM  Result Value Ref Range   Troponin I (High Sensitivity) 7 <18 ng/L    Comment: (NOTE) Elevated high sensitivity troponin I (hsTnI) values and significant  changes across serial measurements may suggest ACS but many other  chronic and acute conditions are known to elevate hsTnI results.  Refer to the "Links" section for chest pain algorithms and additional   guidance. Performed at [facility] [place], [address] [place], [place] [address]    CT Head Wo Contrast  Result Date: 02/03/2020 CLINICAL DATA:  Vomiting and dizziness.  Frontal headache EXAM: CT HEAD WITHOUT CONTRAST TECHNIQUE: Contiguous axial images were obtained from the base of the skull through the vertex without intravenous contrast. COMPARISON:  06/03/2015 FINDINGS: Brain: No evidence of acute infarction, hemorrhage, hydrocephalus, extra-axial collection or mass lesion/mass effect. 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 Vascular: Atherosclerotic calcification. Skull: Normal. Negative for fracture or focal lesion. Sinuses/Orbits: Bilateral cataract resection.  No acute finding. IMPRESSION: 1. No acute finding. 2. Extensive chronic small vessel ischemia in the cerebral white matter. Electronically Signed   By: Jonathon  Watts M.D.   On: 02/03/2020 06:08   DG Chest Portable 1 View  Result Date: 02/03/2020 CLINICAL DATA:  Shortness of breath EXAM: PORTABLE CHEST 1 VIEW COMPARISON:  06/03/2015 FINDINGS: Better inflated than before. There is no edema, consolidation, effusion, or pneumothorax. Normal heart size and mediastinal contours. Artifact from EKG leads. IMPRESSION: Negative. Electronically Signed   By: Jonathon  Watts M.D.   On: 02/03/2020 05:50   CT Angio Chest/Abd/Pel for Dissection W and/or Wo Contrast  Result Date: 02/03/2020 CLINICAL DATA:  Chest pain with nausea and vomiting. EXAM: CT ANGIOGRAPHY CHEST, ABDOMEN AND PELVIS TECHNIQUE: Non-contrast CT of the chest was initially obtained. Multidetector CT imaging through the chest, abdomen and pelvis was performed using the standard protocol during bolus administration of intravenous contrast. Multiplanar reconstructed images and MIPs were obtained and reviewed to evaluate the vascular anatomy. CONTRAST:  100mL OMNIPAQUE IOHEXOL 350 MG/ML SOLN COMPARISON:  Chest x-ray from same day. CT chest,  abdomen, and pelvis dated June 03, 2015. FINDINGS: CTA CHEST FINDINGS Cardiovascular: Preferential opacification of the thoracic aorta. No evidence of thoracic aortic aneurysm or dissection. Mild atherosclerotic calcification of the aortic arch and great vessels. Normal heart size. Chronic trace pericardial effusion. No central pulmonary embolism. Mediastinum/Nodes: No enlarged mediastinal, hilar, or axillary lymph nodes. Unchanged 8 mm hypodense nodule small calcifications in the right thyroid lobe, stable since 2016. Not clinically significant; no follow-up imaging recommended. Trachea and esophagus demonstrate no significant findings. Lungs/Pleura: Lungs are clear. No pleural effusion or pneumothorax. Musculoskeletal: No chest wall abnormality. No acute or significant osseous findings. Chronic mild T10 and T12 superior endplate compression deformities. Review of the MIP images confirms the above findings. CTA ABDOMEN AND PELVIS FINDINGS VASCULAR Aorta: Normal caliber aorta without aneurysm, dissection, vasculitis or significant stenosis. Mild atherosclerotic calcification. Celiac: Patent without evidence of aneurysm, dissection, vasculitis or significant stenosis. SMA: Patent without evidence of aneurysm, dissection, vasculitis or significant stenosis. Renals: Both renal arteries are patent without evidence of aneurysm, dissection, vasculitis, fibromuscular dysplasia or significant stenosis. IMA: Patent without evidence of aneurysm, dissection, vasculitis or significant stenosis. Inflow: Patent without evidence of aneurysm, dissection, vasculitis or significant stenosis. Veins: No obvious venous abnormality within the limitations of this arterial phase study. Review of the MIP images confirms the above findings. NON-VASCULAR Hepatobiliary: No focal liver abnormality is seen. Small gallstones. No gallbladder wall thickening. The common   bile duct measures 7 mm, at the upper limits of normal. Pancreas:  Unremarkable. No pancreatic ductal dilatation or surrounding inflammatory changes. Spleen: Normal in size without focal abnormality. Adrenals/Urinary Tract: Adrenal glands are unremarkable. Kidneys are normal, without renal calculi, focal lesion, or hydronephrosis. Bladder is unremarkable. Stomach/Bowel: Small hiatal hernia. The stomach is otherwise within normal limits. No bowel wall thickening, distention, or surrounding inflammatory changes. Mild sigmoid diverticulosis. Normal appendix. Lymphatic: No enlarged abdominal or pelvic lymph nodes. Reproductive: Status post hysterectomy. No adnexal masses. Other: No abdominal wall hernia or abnormality. No abdominopelvic ascites. No pneumoperitoneum. Musculoskeletal: No acute or significant osseous findings. Review of the MIP images confirms the above findings. IMPRESSION: 1. No evidence of acute aortic syndrome or aneurysm. 2. No acute intrathoracic or intra-abdominal findings. 3. Cholelithiasis. 4. Aortic Atherosclerosis (ICD10-I70.0). Electronically Signed   By: William T Derry M.D.   On: 02/03/2020 06:44      Assessment/Plan Hx mucinous adenocarcinoma of the uterus 2016 Hx central retinal artery occlusion 2016 Type 2 diabetes Hypertension Hypothyroid Hx asthma Hx GERD she had a 1.5 cm gallstone Hx anxiety/depression Hx Legionnaires' disease 1984  Gallstone pancreatitis Cholelithiasis/cholecystitis  FEN: IV fluids/sips of clears tonight/n.p.o. after midnight ID: Cipro/Flagyl 5/13>> DVT: Subcu heparin tonight  Plan: We will let her have sips and chips tonight.  Recheck her labs in the morning.  Her labs and her pancreatitis appear resolved will consider laparoscopic cholecystectomy tomorrow.    JENNINGS,WILLARD, PA-C [facility] 02/03/2020, 10:16 AM Please see Amion for pager number during day hours 7:00am-4:30pm   

## 2020-02-03 NOTE — H&P (Signed)
History and Physical    Vickie Chang JIR:[id number] DOB: 12/22/1945 DOA: 02/03/2020  PCP: [facility], PA   Patient coming from: Home  Chief Complaint: Epigastric pain  HPI: Vickie Chang is a 74 y.o. 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  Pain is described as sharp stabbing with radiation to the back, started suddenly, constant in nature and relieved with vomiting.She had associated nausea vomiting flatus, heart burn. She c/o fatigue /tiredness and has been having nausea and vomiting once a week. Patient otherwise denies any shortness of breath, chest pain, chills,headache, focal weakness, numbness tingling, speech difficulties, diarrhea. She never had colonoscopy.  ED Course:Patient blood pressure elevated 170s, tachycardic blood work showed hypokalemia 3.1 hyperglycemia lipase 81 leukocytosis 17.9 CT head without contrast and chest x-ray were negative without acute finding.  Patient was given potassium iv 40 meq, IV Cipro and Flagyl, from, IV fluids.  She underwent CT angio chest abdomen pelvis that showed cholelithiasis.  No ultrasound tech was available during the night, case was discussed with the night MD and Ninfa Linden from general surgery requested admission to [facility] service and surgery will consult and patient was transferred to [facility] and arrived in the afternoon. She had temp of 100.3 today noon.Ultrasound abdomen was subsequently obtained that showed cholelithiasis, no evidence of acute cholecystitis, top normal CBD at 6 mm, diffusely mildly echogenic liver parenchyma.  Review of Systems: All systems were reviewed and were negative except as mentioned in HPI above. Negative for chest pain Negative for shortness of breath  Past Medical  History:  Diagnosis Date  . Anxiety   . Central retinal artery occlusion, right eye    a. Dx 2016 - admitted with transient visual disturbance, had recently been dx with central retinal artery occlusion.  . Cough   . Depression   . Diet-controlled diabetes mellitus ([place])   . GERD (gastroesophageal reflux disease)   . Hyperlipidemia   . Hypertension   . Hypokalemia   . Hypothyroidism   . Intrinsic asthma   . Legionnaire's disease ([place])    1984    Past Surgical History:  Procedure Laterality Date  . TEE WITHOUT CARDIOVERSION N/A 05/26/2015   Procedure: TRANSESOPHAGEAL ECHOCARDIOGRAM (TEE);  Surgeon: Larey Dresser, MD;  Location: [facility];  Service: Cardiovascular;  Laterality: N/A;  . TOTAL ABDOMINAL HYSTERECTOMY W/ BILATERAL SALPINGOOPHORECTOMY  06/27/15   and excision of umbilical hernia sac, performed by Dr. Fermin Schwab at [facility]     reports that she has never smoked. She has never used smokeless tobacco. She reports that she does not drink alcohol or use drugs.  Allergies  Allergen Reactions  . Sulfa Antibiotics Hives  . Tetanus Toxoids Swelling  . Codeine Other (See Comments)    gi upset  . Penicillins Swelling  . Sulfonamide Derivatives Hives    Family History  Problem Relation Age of Onset  . Heart attack Father        died in his 20's of MI  . Stroke Father   . Asthma Sister      Prior to Admission medications   Medication Sig Start Date End Date Taking? Authorizing Provider  acetaminophen (TYLENOL) 325 MG tablet Take 650 mg by mouth every 6 (six) hours as needed.    [provider]  ALPRAZolam (XANAX) 0.25 MG tablet Take 0.25 mg by mouth every 12 (twelve) hours as needed.    [provider]  aspirin EC 325 MG tablet Take 1 tablet (325 mg total) by mouth daily. Patient taking differently: Take 81 mg by mouth daily.  04/28/15   Orson Eva, MD  atorvastatin (LIPITOR) 20 MG tablet Take 1 tablet (20 mg total) by mouth daily at 6  PM. 04/28/15   Tat, Shanon Brow, MD  Azelastine-Fluticasone 137-50 MCG/ACT SUSP Place 1 spray into both nostrils 2 (two) times daily. 01/13/20   [provider]  buPROPion (WELLBUTRIN XL) 150 MG 24 hr tablet Take 450 mg by mouth daily. 01/07/20   [provider]  buPROPion (ZYBAN) 150 MG 12 hr tablet Take 450 mg by mouth daily.    [provider]  clotrimazole-betamethasone (LOTRISONE) cream APP AA BID 05/20/15   [provider]  dexlansoprazole (DEXILANT) 60 MG capsule Take 60 mg by mouth daily.    [provider]  dextromethorphan-guaiFENesin (MUCINEX DM) 30-600 MG per 12 hr tablet Take 1 tablet by mouth every 12 (twelve) hours.    [provider]  Eszopiclone (ESZOPICLONE) 3 MG TABS Take 3 mg by mouth at bedtime. Take immediately before bedtime    [provider]  famotidine (PEPCID) 20 MG tablet Take 20 mg by mouth 2 (two) times daily.     [provider]  finasteride (PROSCAR) 5 MG tablet Take 2.5 mg by mouth daily.  05/20/15   [provider]  FLOVENT HFA 220 MCG/ACT inhaler INHALE 1 PUFF DAILY 05/20/15   [provider]  FLUoxetine (PROZAC) 20 MG capsule Take 20 mg by mouth daily. 01/07/20   [provider]  hydrochlorothiazide (HYDRODIURIL) 25 MG tablet TK 1 T PO QAM 06/18/15   [provider]  Hydrocod Polst-Chlorphen Polst 10-8 MG CP12 Take by mouth.    [provider]  latanoprost (XALATAN) 0.005 % ophthalmic solution INSTILL ONE DROP IN OU QHS 06/18/15   [provider]  levothyroxine (SYNTHROID) 88 MCG tablet Take 88 mcg by mouth daily. 01/07/20   [provider]  levothyroxine (SYNTHROID, LEVOTHROID) 100 MCG tablet Take 100 mcg by mouth daily.    [provider]  losartan (COZAAR) 100 MG tablet Take 1 tablet (100 mg total) by mouth daily. 04/28/15   Orson Eva, MD  meloxicam (MOBIC) 7.5 MG tablet TK 2 TS PO QD 06/18/15   [provider]  Multiple  Vitamins-Minerals (CENTRUM SILVER ULTRA WOMENS PO) Take 1 tablet by mouth daily.    [provider]  nebivolol (BYSTOLIC) 5 MG tablet Take 1 tablet (5 mg total) by mouth daily. 04/28/15   Orson Eva, MD  omeprazole (PRILOSEC) 40 MG capsule Take 40 mg by mouth daily. 01/07/20   [provider]  ondansetron (ZOFRAN-ODT) 4 MG disintegrating tablet TK 1 T PO Q 6 H PRN FOR UP TO 7 DAYS. 06/18/15   [provider]  potassium chloride SA (K-DUR,KLOR-CON) 20 MEQ tablet Take 60 mEq by mouth daily.    [provider]  traMADol (ULTRAM) 50 MG tablet 1 -2 every 4 hrs. As needed    [provider]    Physical Exam: Vitals:   02/03/20 1000 02/03/20 1047 02/03/20 1230 02/03/20 1401  BP: (!) 170/71 (!) 148/66 131/64 140/71  Pulse: (!) 104 (!) 103 100 97  Resp: (!) 22 17 (!) 21 17  Temp:   100.3 F (37.9 C) 98.4 F (36.9 C)  TempSrc:  Oral Oral  SpO2: 99% 95% 95% 98%  Weight:      Height:        General exam: AAOx3,NAD, weak appearing. HEENT:Oral mucosa moist, Ear/Nose WNL grossly, dentition normal. Respiratory system: bilaterally clear,no wheezing or crackles,no use of accessory muscle Cardiovascular system: S1 & S2 +, No JVD,. Gastrointestinal system: Abdomen soft, Tender RUQ,ND, BS+ Nervous System:Alert, awake, moving extremities and grossly nonfocal Extremities: No edema, distal peripheral pulses palpable.  Skin: No rashes,no icterus. MSK: Normal muscle bulk,tone, power   Labs on Admission: I have personally reviewed following labs and imaging studies  CBC: Recent Labs  Lab 02/03/20 0512  WBC 17.9*  NEUTROABS 16.9*  HGB 13.2  HCT 38.4  MCV 84.4  PLT 638   Basic Metabolic Panel: Recent Labs  Lab 02/03/20 0513  NA 141  K 3.1*  CL 103  CO2 27  GLUCOSE 153*  BUN 11  CREATININE 0.98  CALCIUM 8.9   GFR: Estimated Creatinine Clearance: 43.7 mL/min (by C-G formula based on SCr of 0.98 mg/dL). Liver Function Tests: Recent Labs  Lab  02/03/20 0513  AST 181*  ALT 94*  ALKPHOS 110  BILITOT 1.2  PROT 6.9  ALBUMIN 4.0   Recent Labs  Lab 02/03/20 0513  LIPASE 81*   No results for input(s): AMMONIA in the last 168 hours. Coagulation Profile: No results for input(s): INR, PROTIME in the last 168 hours. Cardiac Enzymes: No results for input(s): CKTOTAL, CKMB, CKMBINDEX, TROPONINI in the last 168 hours. BNP (last 3 results) No results for input(s): PROBNP in the last 8760 hours. HbA1C: No results for input(s): HGBA1C in the last 72 hours. CBG: Recent Labs  Lab 02/03/20 1352  GLUCAP 190*   Lipid Profile: No results for input(s): CHOL, HDL, LDLCALC, TRIG, CHOLHDL, LDLDIRECT in the last 72 hours. Thyroid Function Tests: No results for input(s): TSH, T4TOTAL, FREET4, T3FREE, THYROIDAB in the last 72 hours. Anemia Panel: No results for input(s): VITAMINB12, FOLATE, FERRITIN, TIBC, IRON, RETICCTPCT in the last 72 hours. Urine analysis: No results found for: COLORURINE, APPEARANCEUR, [place], [place], GLUCOSEU, HGBUR, BILIRUBINUR, KETONESUR, PROTEINUR, UROBILINOGEN, NITRITE, LEUKOCYTESUR  Radiological Exams on Admission: CT Head Wo Contrast  Result Date: 02/03/2020 CLINICAL DATA:  Vomiting and dizziness.  Frontal headache EXAM: CT HEAD WITHOUT CONTRAST TECHNIQUE: Contiguous axial images were obtained from the base of the skull through the vertex without intravenous contrast. COMPARISON:  06/03/2015 FINDINGS: Brain: No evidence of acute infarction, hemorrhage, hydrocephalus, extra-axial collection or mass lesion/mass effect. 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 Vascular: Atherosclerotic calcification. Skull: Normal. Negative for fracture or focal lesion. Sinuses/Orbits: Bilateral cataract resection.  No acute finding. IMPRESSION: 1. No acute finding. 2. Extensive chronic small vessel ischemia in the cerebral white matter. Electronically Signed   By: Monte Fantasia M.D.   On:  02/03/2020 06:08   DG Chest Portable 1 View  Result Date: 02/03/2020 CLINICAL DATA:  Shortness of breath EXAM: PORTABLE CHEST 1 VIEW COMPARISON:  06/03/2015 FINDINGS: Better inflated than before. There is no edema, consolidation, effusion, or pneumothorax. Normal heart size and mediastinal contours. Artifact from EKG leads. IMPRESSION: Negative. Electronically Signed   By: Monte Fantasia M.D.   On: 02/03/2020 05:50   CT Angio Chest/Abd/Pel for Dissection W and/or Wo Contrast  Result Date: 02/03/2020 CLINICAL DATA:  Chest pain with nausea and vomiting. EXAM: CT ANGIOGRAPHY CHEST, ABDOMEN AND PELVIS TECHNIQUE: Non-contrast CT of the chest was initially obtained. Multidetector CT imaging through the chest, abdomen and  pelvis was performed using the standard protocol during bolus administration of intravenous contrast. Multiplanar reconstructed images and MIPs were obtained and reviewed to evaluate the vascular anatomy. CONTRAST:  151m OMNIPAQUE IOHEXOL 350 MG/ML SOLN COMPARISON:  Chest x-ray from same day. CT chest, abdomen, and pelvis dated June 03, 2015. FINDINGS: CTA CHEST FINDINGS Cardiovascular: Preferential opacification of the thoracic aorta. No evidence of thoracic aortic aneurysm or dissection. Mild atherosclerotic calcification of the aortic arch and great vessels. Normal heart size. Chronic trace pericardial effusion. No central pulmonary embolism. Mediastinum/Nodes: No enlarged mediastinal, hilar, or axillary lymph nodes. Unchanged 8 mm hypodense nodule small calcifications in the right thyroid lobe, stable since 2016. Not clinically significant; no follow-up imaging recommended. Trachea and esophagus demonstrate no significant findings. Lungs/Pleura: Lungs are clear. No pleural effusion or pneumothorax. Musculoskeletal: No chest wall abnormality. No acute or significant osseous findings. Chronic mild T10 and T12 superior endplate compression deformities. Review of the MIP images confirms the  above findings. CTA ABDOMEN AND PELVIS FINDINGS VASCULAR Aorta: Normal caliber aorta without aneurysm, dissection, vasculitis or significant stenosis. Mild atherosclerotic calcification. Celiac: Patent without evidence of aneurysm, dissection, vasculitis or significant stenosis. SMA: Patent without evidence of aneurysm, dissection, vasculitis or significant stenosis. Renals: Both renal arteries are patent without evidence of aneurysm, dissection, vasculitis, fibromuscular dysplasia or significant stenosis. IMA: Patent without evidence of aneurysm, dissection, vasculitis or significant stenosis. Inflow: Patent without evidence of aneurysm, dissection, vasculitis or significant stenosis. Veins: No obvious venous abnormality within the limitations of this arterial phase study. Review of the MIP images confirms the above findings. NON-VASCULAR Hepatobiliary: No focal liver abnormality is seen. Small gallstones. No gallbladder wall thickening. The common bile duct measures 7 mm, at the upper limits of normal. Pancreas: Unremarkable. No pancreatic ductal dilatation or surrounding inflammatory changes. Spleen: Normal in size without focal abnormality. Adrenals/Urinary Tract: Adrenal glands are unremarkable. Kidneys are normal, without renal calculi, focal lesion, or hydronephrosis. Bladder is unremarkable. Stomach/Bowel: Small hiatal hernia. The stomach is otherwise within normal limits. No bowel wall thickening, distention, or surrounding inflammatory changes. Mild sigmoid diverticulosis. Normal appendix. Lymphatic: No enlarged abdominal or pelvic lymph nodes. Reproductive: Status post hysterectomy. No adnexal masses. Other: No abdominal wall hernia or abnormality. No abdominopelvic ascites. No pneumoperitoneum. Musculoskeletal: No acute or significant osseous findings. Review of the MIP images confirms the above findings. IMPRESSION: 1. No evidence of acute aortic syndrome or aneurysm. 2. No acute intrathoracic or  intra-abdominal findings. 3. Cholelithiasis. 4. Aortic Atherosclerosis (ICD10-I70.0). Electronically Signed   By: WTitus DubinM.D.   On: 02/03/2020 06:44   UKoreaAbdomen Limited RUQ  Result Date: 02/03/2020 CLINICAL DATA:  Right upper quadrant abdominal pain. Cholelithiasis on CT from earlier today. EXAM: ULTRASOUND ABDOMEN LIMITED RIGHT UPPER QUADRANT COMPARISON:  02/03/2020 chest, abdomen and pelvis CT angiogram. FINDINGS: Gallbladder: There is a layering 1.5 cm gallstone in mildly distended gallbladder. No gallbladder wall thickening. No sonographic Murphy's sign. No pericholecystic fluid. Common bile duct: Diameter: 6 mm Liver: Liver parenchyma is diffusely mildly echogenic. No definite liver surface irregularity. No liver masses, noting decreased sensitivity in the setting of an echogenic liver. Portal vein is patent on color Doppler imaging with normal direction of blood flow towards the liver. Other: None. IMPRESSION: 1. Cholelithiasis.  No evidence of acute cholecystitis. 2. Top-normal common bile duct diameter (6 mm). 3. Diffusely mildly echogenic liver parenchyma, nonspecific, which can be due to hepatic steatosis or fibrosis. Consider outpatient hepatic elastography for further liver fibrosis risk stratification, as clinically warranted.  Electronically Signed   By: Ilona Sorrel M.D.   On: 02/03/2020 10:32   Assessment/Plan  Gallstone pancreatitis/Symptomatic cholelithiasis:Epigastric abdominal pain: With abnormal AST ALT at 181, 94 and lipase elevated 81.  Serial troponin negative, suspecting gallstone pancreatitis.  CCS is on consult await further input- anticipating cholecystectomy, discussed with surgery PA. Keep n.p.o., IV fluid hydration, iv cipro/flagyl. Currently pain is controlled.  Hypokalemia was replaced. Repeat in the morning.  Leucocytosis likely from #1. Mild temp. S/p cipro/flagyl In ED,and will continue the same.  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  Anxiety/depression-on Xanax and Wellbutrin, resume slowly.  DM diet controlled: Monitor sugar.  Last hba1c 6.2 in 2016.  GERD- add ppi iv for now.  HTN: Blood pressure currently stable.Hold home meds.  HLD: Hold statin in the setting of #1.  Hypothyroidism: Resume Synthroid.  Med rec pending- complete med rec once done  Body mass index is 27.99 kg/m.   Severity of Illness: * I certify that at the point of admission it is my clinical judgment that the patient will require inpatient hospital care spanning beyond 2 midnights from the point of admission due to high intensity of service, high risk for further deterioration and high frequency of surveillance required due to abdominal pain, gallstone pancreatitis, n.p.o. status and for IV fluid hydration.*  DVT prophylaxis:  SCD/heparin  Code Status: Full code Family Communication: Admission, patients condition and plan of care including tests being ordered have been discussed with the patient and patient family at bedside who indicate understanding and agree with the plan and Code Status.  Consults called:   Antonieta Pert MD [facility]  If 7PM-7AM, please contact night-coverage [email or web address]  02/03/2020, 3:50 PM

## 2020-02-03 NOTE — ED Provider Notes (Addendum)
Vickie Chang   CSN: [id number] Arrival date & time: 02/03/20  0500     History Chief Complaint  Patient presents with  . Chest Pain    Vickie Chang is a 74 y.o. female.  The history is provided by the patient.  Abdominal Pain Pain location:  Epigastric Pain quality: sharp and stabbing   Pain radiates to:  Back Pain severity:  Severe Onset quality:  Sudden Timing:  Constant Progression:  Unchanged Chronicity:  New Context: eating   Relieved by:  Vomiting Worsened by:  Nothing Ineffective treatments:  None tried Associated symptoms: belching, diarrhea, flatus, nausea and vomiting   Associated symptoms: no anorexia, no constipation, no cough, no dysuria, no fever and no shortness of breath   Risk factors: being elderly   Patient with h/o HTN, Hypercholesterolemia has been "feeling off for a week." lightheaded with belching and hiccupping and ate pizza tonight and then      Past Medical History:  Diagnosis Date  . Anxiety   . Central retinal artery occlusion, right eye    a. Dx 2016 - admitted with transient visual disturbance, had recently been dx with central retinal artery occlusion.  . Cough   . Depression   . Diet-controlled diabetes mellitus ([place])   . GERD (gastroesophageal reflux disease)   . Hyperlipidemia   . Hypertension   . Hypokalemia   . Hypothyroidism   . Intrinsic asthma   . Legionnaire's disease [facility])    1984    Patient Active Problem List   Diagnosis Date Noted  . Family history of coronary artery disease in father 05/26/2015  . TIA (transient ischemic attack) 04/28/2015  . Hypokalemia 04/28/2015  . HLD (hyperlipidemia)   . Change in vision 04/27/2015  . Central retinal artery occlusion of right eye 04/27/2015  . Intrinsic asthma   . Hypothyroidism   . GERD (gastroesophageal reflux disease)   . Depression   . Anxiety   . Essential hypertension, benign 01/04/2008  . COUGH 10/19/2007     Past Surgical History:  Procedure Laterality Date  . TEE WITHOUT CARDIOVERSION N/A 05/26/2015   Procedure: TRANSESOPHAGEAL ECHOCARDIOGRAM (TEE);  Surgeon: Larey Dresser, MD;  Location: [facility];  Service: Cardiovascular;  Laterality: N/A;  . TOTAL ABDOMINAL HYSTERECTOMY W/ BILATERAL SALPINGOOPHORECTOMY  06/27/15   and excision of umbilical hernia sac, performed by Dr. Fermin Schwab at [facility]     OB History   No obstetric history on file.     Family History  Problem Relation Age of Onset  . Heart attack Father        died in his 81's of MI  . Stroke Father   . Asthma Sister     Social History   Tobacco Use  . Smoking status: Never Smoker  . Smokeless tobacco: Never Used  Substance Use Topics  . Alcohol use: No  . Drug use: No    Home Medications Prior to Admission medications   Medication Sig Start Date End Date Taking? Authorizing Provider  acetaminophen (TYLENOL) 325 MG tablet Take 650 mg by mouth every 6 (six) hours as needed.    [provider]  ALPRAZolam Duanne Moron) 0.25 MG tablet Take 0.25 mg by mouth every 12 (twelve) hours as needed.    [provider]  aspirin EC 325 MG tablet Take 1 tablet (325 mg total) by mouth daily. Patient taking differently: Take 81 mg by mouth daily.  04/28/15   Orson Eva, MD  atorvastatin (LIPITOR) 20 MG tablet Take 1 tablet (20 mg total) by mouth daily at 6 PM. 04/28/15   Tat, Shanon Brow, MD  buPROPion (ZYBAN) 150 MG 12 hr tablet Take 450 mg by mouth daily.    [provider]  clotrimazole-betamethasone (LOTRISONE) cream APP AA BID 05/20/15   [provider]  dexlansoprazole (DEXILANT) 60 MG capsule Take 60 mg by mouth daily.    [provider]  dextromethorphan-guaiFENesin (MUCINEX DM) 30-600 MG per 12 hr tablet Take 1 tablet by mouth every 12 (twelve) hours.    [provider]  Eszopiclone (ESZOPICLONE) 3 MG TABS Take 3 mg by mouth at bedtime. Take immediately before bedtime     [provider]  famotidine (PEPCID) 20 MG tablet Take 20 mg by mouth 2 (two) times daily.     [provider]  finasteride (PROSCAR) 5 MG tablet Take 2.5 mg by mouth daily.  05/20/15   [provider]  [place] HFA 220 MCG/ACT inhaler INHALE 1 PUFF DAILY 05/20/15   [provider]  hydrochlorothiazide (HYDRODIURIL) 25 MG tablet TK 1 T PO QAM 06/18/15   [provider]  Hydrocod Polst-Chlorphen Polst 10-8 MG CP12 Take by mouth.    [provider]  latanoprost (XALATAN) 0.005 % ophthalmic solution INSTILL ONE DROP IN OU QHS 06/18/15   [provider]  levothyroxine (SYNTHROID, LEVOTHROID) 100 MCG tablet Take 100 mcg by mouth daily.    [provider]  losartan (COZAAR) 100 MG tablet Take 1 tablet (100 mg total) by mouth daily. 04/28/15   Orson Eva, MD  meloxicam (MOBIC) 7.5 MG tablet TK 2 TS PO QD 06/18/15   [provider]  Multiple Vitamins-Minerals (CENTRUM SILVER ULTRA WOMENS PO) Take 1 tablet by mouth daily.    [provider]  nebivolol (BYSTOLIC) 5 MG tablet Take 1 tablet (5 mg total) by mouth daily. 04/28/15   Orson Eva, MD  ondansetron (ZOFRAN-ODT) 4 MG disintegrating tablet TK 1 T PO Q 6 H PRN FOR UP TO 7 DAYS. 06/18/15   [provider]  potassium chloride SA (K-DUR,KLOR-CON) 20 MEQ tablet Take 60 mEq by mouth daily.    [provider]  traMADol (ULTRAM) 50 MG tablet 1 -2 every 4 hrs. As needed    [provider]    Allergies    Sulfa antibiotics, Codeine, Penicillins, and Sulfonamide derivatives  Review of Systems   Review of Systems  Constitutional: Negative for fever.  HENT: Negative for congestion.   Eyes: Negative for visual disturbance.  Respiratory: Negative for cough and shortness of breath.   Cardiovascular: Negative for palpitations and leg swelling.  Gastrointestinal: Positive for abdominal pain, diarrhea, flatus, nausea and vomiting. Negative for anorexia and  constipation.  Genitourinary: Negative for dysuria.  Musculoskeletal: Negative for arthralgias.  Skin: Negative for color change.  Neurological: Negative for speech difficulty, weakness, numbness and headaches.  Psychiatric/Behavioral: Negative for agitation.  All other systems reviewed and are negative.   Physical Exam Updated Vital Signs There were no vitals taken for this visit.  Physical Exam Vitals and nursing Chang reviewed.  Constitutional:      Appearance: Normal appearance. She is not diaphoretic.  HENT:     Head: Normocephalic and atraumatic.     Nose: Nose normal.  Eyes:     Conjunctiva/sclera: Conjunctivae normal.     Pupils: Pupils are equal, round, and reactive to light.  Cardiovascular:     Rate and Rhythm: Normal rate and regular rhythm.  Pulses: Normal pulses.     Heart sounds: Normal heart sounds.  Pulmonary:     Effort: Pulmonary effort is normal.     Breath sounds: Normal breath sounds.  Abdominal:     General: Bowel sounds are normal.     Palpations: Abdomen is soft.     Tenderness: There is abdominal tenderness in the epigastric area. There is guarding. Positive signs include Murphy's sign.  Musculoskeletal:        General: Normal range of motion.     Cervical back: Normal range of motion and neck supple.  Skin:    General: Skin is warm and dry.     Capillary Refill: Capillary refill takes less than 2 seconds.  Neurological:     General: No focal deficit present.     Mental Status: She is alert and oriented to person, place, and time.     Deep Tendon Reflexes: Reflexes normal.  Psychiatric:        Mood and Affect: Mood is anxious.     ED Results / Procedures / Treatments   Labs (all labs ordered are listed, but only abnormal results are displayed) Results for orders placed or performed during the hospital encounter of 02/03/20  SARS Coronavirus 2 by RT PCR (hospital order, performed in [place] hospital lab) Nasopharyngeal Nasopharyngeal  Swab   Specimen: Nasopharyngeal Swab  Result Value Ref Range   SARS Coronavirus 2 NEGATIVE NEGATIVE  CBC with Differential/Platelet  Result Value Ref Range   WBC 17.9 (H) 4.0 - 10.5 K/uL   RBC 4.55 3.87 - 5.11 MIL/uL   Hemoglobin 13.2 12.0 - 15.0 g/dL   HCT 38.4 36.0 - 46.0 %   MCV 84.4 80.0 - 100.0 fL   MCH 29.0 26.0 - 34.0 pg   MCHC 34.4 30.0 - 36.0 g/dL   RDW 13.4 11.5 - 15.5 %   Platelets 246 150 - 400 K/uL   nRBC 0.0 0.0 - 0.2 %   Neutrophils Relative % 94 %   Neutro Abs 16.9 (H) 1.7 - 7.7 K/uL   Lymphocytes Relative 4 %   Lymphs Abs 0.6 (L) 0.7 - 4.0 K/uL   Monocytes Relative 1 %   Monocytes Absolute 0.1 0.1 - 1.0 K/uL   Eosinophils Relative 1 %   Eosinophils Absolute 0.1 0.0 - 0.5 K/uL   Basophils Relative 0 %   Basophils Absolute 0.0 0.0 - 0.1 K/uL   Immature Granulocytes 0 %   Abs Immature Granulocytes 0.06 0.00 - 0.07 K/uL  Comprehensive metabolic panel  Result Value Ref Range   Sodium 141 135 - 145 mmol/L   Potassium 3.1 (L) 3.5 - 5.1 mmol/L   Chloride 103 98 - 111 mmol/L   CO2 27 22 - 32 mmol/L   Glucose, Bld 153 (H) 70 - 99 mg/dL   BUN 11 8 - 23 mg/dL   Creatinine, Ser 0.98 0.44 - 1.00 mg/dL   Calcium 8.9 8.9 - 10.3 mg/dL   Total Protein 6.9 6.5 - 8.1 g/dL   Albumin 4.0 3.5 - 5.0 g/dL   AST 181 (H) 15 - 41 U/L   ALT 94 (H) 0 - 44 U/L   Alkaline Phosphatase 110 38 - 126 U/L   Total Bilirubin 1.2 0.3 - 1.2 mg/dL   GFR calc non Af Amer 58 (L) >60 mL/min   GFR calc Af Amer >60 >60 mL/min   Anion gap 11 5 - 15  Lipase, blood  Result Value Ref Range   Lipase 81 (  H) 11 - 51 U/L  Troponin I (High Sensitivity)  Result Value Ref Range   Troponin I (High Sensitivity) 5 <18 ng/L   CT Head Wo Contrast  Result Date: 02/03/2020 CLINICAL DATA:  Vomiting and dizziness.  Frontal headache EXAM: CT HEAD WITHOUT CONTRAST TECHNIQUE: Contiguous axial images were obtained from the base of the skull through the vertex without intravenous contrast. COMPARISON:  06/03/2015  FINDINGS: Brain: No evidence of acute infarction, hemorrhage, hydrocephalus, extra-axial collection or mass lesion/mass effect. 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 Vascular: Atherosclerotic calcification. Skull: Normal. Negative for fracture or focal lesion. Sinuses/Orbits: Bilateral cataract resection.  No acute finding. IMPRESSION: 1. No acute finding. 2. Extensive chronic small vessel ischemia in the cerebral white matter. Electronically Signed   By: Monte Fantasia M.D.   On: 02/03/2020 06:08   DG Chest Portable 1 View  Result Date: 02/03/2020 CLINICAL DATA:  Shortness of breath EXAM: PORTABLE CHEST 1 VIEW COMPARISON:  06/03/2015 FINDINGS: Better inflated than before. There is no edema, consolidation, effusion, or pneumothorax. Normal heart size and mediastinal contours. Artifact from EKG leads. IMPRESSION: Negative. Electronically Signed   By: Monte Fantasia M.D.   On: 02/03/2020 05:50    EKG  Date: 02/03/2020  Rate: 65  Rhythm: normal sinus rhythm  QRS Axis: normal  Intervals: normal  ST/T Wave abnormalities: normal  Conduction Disutrbances: none  Narrative Interpretation: unremarkable    Radiology No results found.  Procedures Procedures (including critical care time)  Medications Ordered in ED Medications  alum & mag hydroxide-simeth (MAALOX/MYLANTA) 200-200-20 MG/5ML suspension 30 mL (has no administration in time range)  ciprofloxacin (CIPRO) IVPB 400 mg (has no administration in time range)    And  metroNIDAZOLE (FLAGYL) IVPB 500 mg (has no administration in time range)  0.9 %  sodium chloride infusion (has no administration in time range)  potassium chloride 10 mEq in 100 mL IVPB (has no administration in time range)  fentaNYL (SUBLIMAZE) injection 100 mcg (has no administration in time range)  alum & mag hydroxide-simeth (MAALOX/MYLANTA) 200-200-20 MG/5ML suspension 30 mL (30 mLs Oral Given 02/03/20 0530)    And  lidocaine  (XYLOCAINE) 2 % viscous mouth solution 15 mL (15 mLs Oral Given 02/03/20 0530)  ondansetron (ZOFRAN) injection 4 mg (4 mg Intravenous Given 02/03/20 0522)  fentaNYL (SUBLIMAZE) injection 50 mcg (50 mcg Intravenous Given 02/03/20 0522)  iohexol (OMNIPAQUE) 350 MG/ML injection 100 mL (100 mLs Intravenous Contrast Given 02/03/20 0551)    ED Course  I have reviewed the triage vital signs and the nursing notes.  Pertinent labs & imaging results that were available during my care of the patient were reviewed by me and considered in my medical decision making (see chart for details).   No signs of acute or subacute stroke on CT scan.  No dissection.  Given risk and patient allergies cipro flagyl was ordered.  IVF initiated along with potassium.     Patient is NPO at this time.    629 Case d/w Dr. Ninfa Linden of [place], please admit to medicine [facility] to consult  Final Clinical Impression(s) / ED Diagnoses    Signed out to Dr. Maryan Rued, pending admission to triad        Paislee Szatkowski, MD 02/03/20 1115

## 2020-02-04 ENCOUNTER — Encounter (HOSPITAL_COMMUNITY): Payer: Self-pay | Admitting: Internal Medicine

## 2020-02-04 ENCOUNTER — Inpatient Hospital Stay (HOSPITAL_COMMUNITY): Payer: Medicare Other

## 2020-02-04 ENCOUNTER — Encounter (HOSPITAL_COMMUNITY)
Admission: EM | Disposition: A | Discharge status: Home / Self Care | Payer: Self-pay | Source: Home / Self Care | Attending: Internal Medicine

## 2020-02-04 ENCOUNTER — Inpatient Hospital Stay (HOSPITAL_COMMUNITY): Payer: Medicare Other | Admitting: Certified Registered Nurse Anesthetist

## 2020-02-04 DIAGNOSIS — R1013 Epigastric pain: Secondary | ICD-10-CM

## 2020-02-04 HISTORY — PX: CHOLECYSTECTOMY: SHX55

## 2020-02-04 LAB — SURGICAL PCR SCREEN
MRSA, PCR: POSITIVE — AB
Staphylococcus aureus: POSITIVE — AB

## 2020-02-04 LAB — PROTIME-INR
INR: 1.2 (ref 0.8–1.2)
Prothrombin Time: 15.2 seconds (ref 11.4–15.2)

## 2020-02-04 LAB — COMPREHENSIVE METABOLIC PANEL
ALT: 291 U/L — ABNORMAL HIGH (ref 0–44)
AST: 284 U/L — ABNORMAL HIGH (ref 15–41)
Albumin: 3.6 g/dL (ref 3.5–5.0)
Alkaline Phosphatase: 107 U/L (ref 38–126)
Anion gap: 10 (ref 5–15)
BUN: 9 mg/dL (ref 8–23)
CO2: 25 mmol/L (ref 22–32)
Calcium: 8.4 mg/dL — ABNORMAL LOW (ref 8.9–10.3)
Chloride: 107 mmol/L (ref 98–111)
Creatinine, Ser: 0.85 mg/dL (ref 0.44–1.00)
GFR calc Af Amer: >60 mL/min (ref >60)
GFR calc non Af Amer: >60 mL/min (ref >60)
Glucose, Bld: 99 mg/dL (ref 70–99)
Potassium: 3.6 mmol/L (ref 3.5–5.1)
Sodium: 142 mmol/L (ref 135–145)
Total Bilirubin: 3.9 mg/dL — ABNORMAL HIGH (ref 0.3–1.2)
Total Protein: 6.2 g/dL — ABNORMAL LOW (ref 6.5–8.1)

## 2020-02-04 LAB — LIPASE, BLOOD: Lipase: 20 U/L (ref 11–51)

## 2020-02-04 LAB — CBC
HCT: 35.3 % — ABNORMAL LOW (ref 36.0–46.0)
Hemoglobin: 11.6 g/dL — ABNORMAL LOW (ref 12.0–15.0)
MCH: 28.7 pg (ref 26.0–34.0)
MCHC: 32.9 g/dL (ref 30.0–36.0)
MCV: 87.4 fL (ref 80.0–100.0)
Platelets: 188 10*3/uL (ref 150–400)
RBC: 4.04 MIL/uL (ref 3.87–5.11)
RDW: 13.6 % (ref 11.5–15.5)
WBC: 13.2 10*3/uL — ABNORMAL HIGH (ref 4.0–10.5)
nRBC: 0 % (ref 0.0–0.2)

## 2020-02-04 SURGERY — LAPAROSCOPIC CHOLECYSTECTOMY WITH INTRAOPERATIVE CHOLANGIOGRAM
Anesthesia: General

## 2020-02-04 MED ORDER — LIP MEDEX EX OINT
1.0000 "application " | TOPICAL_OINTMENT | Freq: Two times a day (BID) | CUTANEOUS | Status: DC
  Administered 2020-02-04 – 2020-02-06 (×3): 1 via TOPICAL
  Filled 2020-02-04: qty 7

## 2020-02-04 MED ORDER — ACETAMINOPHEN 500 MG PO TABS
1000.0000 mg | ORAL_TABLET | ORAL | Status: AC
  Administered 2020-02-04: 1000 mg via ORAL

## 2020-02-04 MED ORDER — FENTANYL CITRATE (PF) 100 MCG/2ML IJ SOLN
INTRAMUSCULAR | Status: AC
  Filled 2020-02-04: qty 2

## 2020-02-04 MED ORDER — TRAMADOL HCL 50 MG PO TABS: 50.0000 mg | ORAL_TABLET | Freq: Every day | ORAL | Status: DC | PRN

## 2020-02-04 MED ORDER — MAGIC MOUTHWASH
15.0000 mL | Freq: Four times a day (QID) | ORAL | Status: DC | PRN
  Filled 2020-02-04: qty 15

## 2020-02-04 MED ORDER — PROPOFOL 10 MG/ML IV BOLUS
INTRAVENOUS | Status: AC
  Filled 2020-02-04: qty 20

## 2020-02-04 MED ORDER — PHENYLEPHRINE 40 MCG/ML (10ML) SYRINGE FOR IV PUSH (FOR BLOOD PRESSURE SUPPORT)
PREFILLED_SYRINGE | INTRAVENOUS | Status: DC | PRN
  Administered 2020-02-04: 160 ug via INTRAVENOUS
  Administered 2020-02-04 (×2): 120 ug via INTRAVENOUS

## 2020-02-04 MED ORDER — SUGAMMADEX SODIUM 200 MG/2ML IV SOLN
INTRAVENOUS | Status: DC | PRN
  Administered 2020-02-04: 200 mg via INTRAVENOUS

## 2020-02-04 MED ORDER — LIDOCAINE 2% (20 MG/ML) 5 ML SYRINGE
INTRAMUSCULAR | Status: AC
  Filled 2020-02-04: qty 5

## 2020-02-04 MED ORDER — LACTATED RINGERS IV BOLUS
1000.0000 mL | Freq: Three times a day (TID) | INTRAVENOUS | Status: DC | PRN
  Administered 2020-02-05: 1000 mL via INTRAVENOUS

## 2020-02-04 MED ORDER — ACETAMINOPHEN 500 MG PO TABS
1000.0000 mg | ORAL_TABLET | Freq: Three times a day (TID) | ORAL | Status: DC
  Administered 2020-02-04 – 2020-02-06 (×4): 1000 mg via ORAL
  Filled 2020-02-04 (×4): qty 2

## 2020-02-04 MED ORDER — CELECOXIB 200 MG PO CAPS
ORAL_CAPSULE | ORAL | Status: AC
  Filled 2020-02-04: qty 1

## 2020-02-04 MED ORDER — FENTANYL CITRATE (PF) 100 MCG/2ML IJ SOLN
INTRAMUSCULAR | Status: AC
  Administered 2020-02-04: 50 ug via INTRAVENOUS
  Filled 2020-02-04: qty 2

## 2020-02-04 MED ORDER — BUPROPION HCL ER (XL) 300 MG PO TB24
450.0000 mg | ORAL_TABLET | Freq: Every day | ORAL | Status: DC
  Administered 2020-02-06: 450 mg via ORAL
  Filled 2020-02-04: qty 1

## 2020-02-04 MED ORDER — ROCURONIUM BROMIDE 10 MG/ML (PF) SYRINGE
PREFILLED_SYRINGE | INTRAVENOUS | Status: DC | PRN
  Administered 2020-02-04: 70 mg via INTRAVENOUS

## 2020-02-04 MED ORDER — ONDANSETRON HCL 4 MG/2ML IJ SOLN
INTRAMUSCULAR | Status: DC | PRN
  Administered 2020-02-04: 4 mg via INTRAVENOUS

## 2020-02-04 MED ORDER — BUPIVACAINE LIPOSOME 1.3 % IJ SUSP
20.0000 mL | Freq: Once | INTRAMUSCULAR | Status: DC
  Filled 2020-02-04: qty 20

## 2020-02-04 MED ORDER — ENALAPRILAT 1.25 MG/ML IV SOLN
0.6250 mg | Freq: Four times a day (QID) | INTRAVENOUS | Status: DC | PRN
  Filled 2020-02-04: qty 1

## 2020-02-04 MED ORDER — FENTANYL CITRATE (PF) 100 MCG/2ML IJ SOLN
INTRAMUSCULAR | Status: DC | PRN
  Administered 2020-02-04 (×3): 50 ug via INTRAVENOUS

## 2020-02-04 MED ORDER — LEVOTHYROXINE SODIUM 88 MCG PO TABS
88.0000 ug | ORAL_TABLET | Freq: Every day | ORAL | Status: DC
  Administered 2020-02-06: 88 ug via ORAL
  Filled 2020-02-04: qty 1

## 2020-02-04 MED ORDER — ONDANSETRON HCL 4 MG/2ML IJ SOLN
INTRAMUSCULAR | Status: AC
  Filled 2020-02-04: qty 2

## 2020-02-04 MED ORDER — PROCHLORPERAZINE EDISYLATE 10 MG/2ML IJ SOLN: 5.0000 mg | INTRAMUSCULAR | Status: DC | PRN

## 2020-02-04 MED ORDER — LABETALOL HCL 5 MG/ML IV SOLN
10.0000 mg | Freq: Once | INTRAVENOUS | Status: AC
  Administered 2020-02-04: 10 mg via INTRAVENOUS

## 2020-02-04 MED ORDER — NEBIVOLOL HCL 5 MG PO TABS
5.0000 mg | ORAL_TABLET | Freq: Every day | ORAL | Status: DC
  Administered 2020-02-04 – 2020-02-06 (×2): 5 mg via ORAL
  Filled 2020-02-04 (×3): qty 1

## 2020-02-04 MED ORDER — GENTAMICIN IN SALINE 1-0.9 MG/ML-% IV SOLN
100.0000 mg | INTRAVENOUS | Status: AC
  Administered 2020-02-04: 100 mg via INTRAVENOUS
  Filled 2020-02-04: qty 100

## 2020-02-04 MED ORDER — HYDROMORPHONE HCL 1 MG/ML IJ SOLN: 0.5000 mg | INTRAMUSCULAR | Status: DC | PRN

## 2020-02-04 MED ORDER — LIDOCAINE 2% (20 MG/ML) 5 ML SYRINGE
INTRAMUSCULAR | Status: DC | PRN
  Administered 2020-02-04: 60 mg via INTRAVENOUS

## 2020-02-04 MED ORDER — SODIUM CHLORIDE 0.9% FLUSH
3.0000 mL | Freq: Two times a day (BID) | INTRAVENOUS | Status: DC
  Administered 2020-02-04 – 2020-02-05 (×2): 3 mL via INTRAVENOUS

## 2020-02-04 MED ORDER — 0.9 % SODIUM CHLORIDE (POUR BTL) OPTIME
TOPICAL | Status: DC | PRN
  Administered 2020-02-04: 1000 mL

## 2020-02-04 MED ORDER — LACTATED RINGERS IV SOLN: INTRAVENOUS | Status: DC

## 2020-02-04 MED ORDER — FENTANYL CITRATE (PF) 100 MCG/2ML IJ SOLN: 25.0000 ug | INTRAMUSCULAR | Status: DC | PRN

## 2020-02-04 MED ORDER — BUPIVACAINE-EPINEPHRINE (PF) 0.5% -1:200000 IJ SOLN
INTRAMUSCULAR | Status: DC | PRN
  Administered 2020-02-04: 30 mL via PERINEURAL

## 2020-02-04 MED ORDER — MUPIROCIN 2 % EX OINT
1.0000 "application " | TOPICAL_OINTMENT | Freq: Two times a day (BID) | CUTANEOUS | Status: DC
  Administered 2020-02-04 – 2020-02-06 (×3): 1 via NASAL
  Filled 2020-02-04: qty 22

## 2020-02-04 MED ORDER — ATORVASTATIN CALCIUM 20 MG PO TABS
20.0000 mg | ORAL_TABLET | Freq: Every day | ORAL | Status: DC
  Administered 2020-02-06: 20 mg via ORAL
  Filled 2020-02-04: qty 1

## 2020-02-04 MED ORDER — DEXAMETHASONE SODIUM PHOSPHATE 10 MG/ML IJ SOLN
INTRAMUSCULAR | Status: AC
  Filled 2020-02-04: qty 1

## 2020-02-04 MED ORDER — METHOCARBAMOL 500 MG PO TABS
1000.0000 mg | ORAL_TABLET | Freq: Four times a day (QID) | ORAL | Status: DC | PRN
  Administered 2020-02-04: 1000 mg via ORAL
  Filled 2020-02-04: qty 2

## 2020-02-04 MED ORDER — MIDAZOLAM HCL 2 MG/2ML IJ SOLN
INTRAMUSCULAR | Status: AC
  Filled 2020-02-04: qty 2

## 2020-02-04 MED ORDER — BUPIVACAINE-EPINEPHRINE 0.5% -1:200000 IJ SOLN
INTRAMUSCULAR | Status: AC
  Filled 2020-02-04: qty 1

## 2020-02-04 MED ORDER — ZOLPIDEM TARTRATE 5 MG PO TABS
5.0000 mg | ORAL_TABLET | Freq: Once | ORAL | Status: AC
  Administered 2020-02-04: 5 mg via ORAL
  Filled 2020-02-04: qty 1

## 2020-02-04 MED ORDER — DEXAMETHASONE SODIUM PHOSPHATE 10 MG/ML IJ SOLN
INTRAMUSCULAR | Status: DC | PRN
  Administered 2020-02-04: 5 mg via INTRAVENOUS

## 2020-02-04 MED ORDER — PHENYLEPHRINE 40 MCG/ML (10ML) SYRINGE FOR IV PUSH (FOR BLOOD PRESSURE SUPPORT)
PREFILLED_SYRINGE | INTRAVENOUS | Status: AC
  Filled 2020-02-04: qty 20

## 2020-02-04 MED ORDER — IOPAMIDOL (ISOVUE-300) INJECTION 61%
INTRAVENOUS | Status: DC | PRN
  Administered 2020-02-04: 20 mL

## 2020-02-04 MED ORDER — CLINDAMYCIN PHOSPHATE 900 MG/50ML IV SOLN
INTRAVENOUS | Status: AC
  Filled 2020-02-04: qty 50

## 2020-02-04 MED ORDER — LIDOCAINE 2% (20 MG/ML) 5 ML SYRINGE
INTRAMUSCULAR | Status: DC | PRN
Start: 2020-02-04 — End: 2020-02-04
  Administered 2020-02-04: 1.5 mg/kg/h via INTRAVENOUS

## 2020-02-04 MED ORDER — SODIUM CHLORIDE 0.9% FLUSH: 3.0000 mL | INTRAVENOUS | Status: DC | PRN

## 2020-02-04 MED ORDER — BUPIVACAINE LIPOSOME 1.3 % IJ SUSP
INTRAMUSCULAR | Status: DC | PRN
  Administered 2020-02-04: 20 mL

## 2020-02-04 MED ORDER — MIDAZOLAM HCL 5 MG/5ML IJ SOLN
INTRAMUSCULAR | Status: DC | PRN
  Administered 2020-02-04: 2 mg via INTRAVENOUS

## 2020-02-04 MED ORDER — LOSARTAN POTASSIUM 50 MG PO TABS
100.0000 mg | ORAL_TABLET | Freq: Every day | ORAL | Status: DC
  Administered 2020-02-04 – 2020-02-06 (×2): 100 mg via ORAL
  Filled 2020-02-04 (×2): qty 2

## 2020-02-04 MED ORDER — METHOCARBAMOL 1000 MG/10ML IJ SOLN
1000.0000 mg | Freq: Four times a day (QID) | INTRAVENOUS | Status: DC | PRN
  Filled 2020-02-04: qty 10

## 2020-02-04 MED ORDER — LACTATED RINGERS IR SOLN
Status: DC | PRN
  Administered 2020-02-04: 3000 mL

## 2020-02-04 MED ORDER — CHLORHEXIDINE GLUCONATE CLOTH 2 % EX PADS
6.0000 | MEDICATED_PAD | Freq: Every day | CUTANEOUS | Status: DC
  Administered 2020-02-05 – 2020-02-06 (×2): 6 via TOPICAL

## 2020-02-04 MED ORDER — SIMETHICONE 40 MG/0.6ML PO SUSP
40.0000 mg | Freq: Four times a day (QID) | ORAL | Status: DC | PRN
  Filled 2020-02-04: qty 0.6

## 2020-02-04 MED ORDER — ACETAMINOPHEN 500 MG PO TABS
ORAL_TABLET | ORAL | Status: AC
  Filled 2020-02-04: qty 2

## 2020-02-04 MED ORDER — LATANOPROST 0.005 % OP SOLN
1.0000 [drp] | Freq: Every day | OPHTHALMIC | Status: DC
  Administered 2020-02-04 – 2020-02-05 (×2): 1 [drp] via OPHTHALMIC
  Filled 2020-02-04: qty 2.5

## 2020-02-04 MED ORDER — BISACODYL 10 MG RE SUPP: 10.0000 mg | Freq: Two times a day (BID) | RECTAL | Status: DC | PRN

## 2020-02-04 MED ORDER — EPHEDRINE 5 MG/ML INJ
INTRAVENOUS | Status: AC
  Filled 2020-02-04: qty 20

## 2020-02-04 MED ORDER — FAMOTIDINE 20 MG PO TABS
40.0000 mg | ORAL_TABLET | Freq: Two times a day (BID) | ORAL | Status: DC
  Administered 2020-02-04 – 2020-02-06 (×3): 40 mg via ORAL
  Filled 2020-02-04 (×3): qty 2

## 2020-02-04 MED ORDER — GABAPENTIN 300 MG PO CAPS
ORAL_CAPSULE | ORAL | Status: AC
  Administered 2020-02-04: 300 mg via ORAL
  Filled 2020-02-04: qty 1

## 2020-02-04 MED ORDER — CLINDAMYCIN PHOSPHATE 600 MG/50ML IV SOLN
600.0000 mg | INTRAVENOUS | Status: AC
  Administered 2020-02-04: 600 mg via INTRAVENOUS
  Filled 2020-02-04: qty 50

## 2020-02-04 MED ORDER — GABAPENTIN 300 MG PO CAPS
300.0000 mg | ORAL_CAPSULE | Freq: Two times a day (BID) | ORAL | Status: DC
  Administered 2020-02-04 – 2020-02-06 (×3): 300 mg via ORAL
  Filled 2020-02-04 (×3): qty 1

## 2020-02-04 MED ORDER — GABAPENTIN 300 MG PO CAPS: 300.0000 mg | ORAL_CAPSULE | ORAL | Status: AC

## 2020-02-04 MED ORDER — DIPHENHYDRAMINE HCL 50 MG/ML IJ SOLN: 12.5000 mg | Freq: Four times a day (QID) | INTRAMUSCULAR | Status: DC | PRN

## 2020-02-04 MED ORDER — PROPOFOL 10 MG/ML IV BOLUS
INTRAVENOUS | Status: DC | PRN
  Administered 2020-02-04: 100 mg via INTRAVENOUS

## 2020-02-04 MED ORDER — ALPRAZOLAM 0.25 MG PO TABS
0.2500 mg | ORAL_TABLET | Freq: Every day | ORAL | Status: DC | PRN
  Administered 2020-02-05: 0.25 mg via ORAL
  Filled 2020-02-04: qty 1

## 2020-02-04 MED ORDER — SODIUM CHLORIDE 0.9 % IV SOLN: 250.0000 mL | INTRAVENOUS | Status: DC | PRN

## 2020-02-04 MED ORDER — LIFITEGRAST 5 % OP SOLN: 1.0000 [drp] | Freq: Every day | OPHTHALMIC | Status: DC

## 2020-02-04 MED ORDER — PHENYLEPHRINE HCL-NACL 10-0.9 MG/250ML-% IV SOLN
INTRAVENOUS | Status: DC | PRN
  Administered 2020-02-04: 50 ug/min via INTRAVENOUS

## 2020-02-04 MED ORDER — ROCURONIUM BROMIDE 10 MG/ML (PF) SYRINGE
PREFILLED_SYRINGE | INTRAVENOUS | Status: AC
  Filled 2020-02-04: qty 10

## 2020-02-04 MED ORDER — LABETALOL HCL 5 MG/ML IV SOLN
INTRAVENOUS | Status: AC
  Filled 2020-02-04: qty 4

## 2020-02-04 MED ORDER — ASPIRIN EC 81 MG PO TBEC
81.0000 mg | DELAYED_RELEASE_TABLET | Freq: Every day | ORAL | Status: DC
  Administered 2020-02-06: 81 mg via ORAL
  Filled 2020-02-04: qty 1

## 2020-02-04 SURGICAL SUPPLY — 47 items
APPLIER CLIP 5 13 M/L LIGAMAX5 (MISCELLANEOUS)
APPLIER CLIP ROT 10 11.4 M/L (STAPLE)
APR CLP MED LRG 11.4X10 (STAPLE)
APR CLP MED LRG 5 ANG JAW (MISCELLANEOUS)
BAG SPEC RTRVL 10 TROC 200 (ENDOMECHANICALS) ×1
CABLE HIGH FREQUENCY MONO STRZ (ELECTRODE) IMPLANT
CLIP APPLIE 5 13 M/L LIGAMAX5 (MISCELLANEOUS) IMPLANT
CLIP APPLIE ROT 10 11.4 M/L (STAPLE) IMPLANT
COVER MAYO STAND STRL (DRAPES) ×2 IMPLANT
COVER SURGICAL LIGHT HANDLE (MISCELLANEOUS) ×2 IMPLANT
COVER WAND RF STERILE (DRAPES) ×2 IMPLANT
DECANTER SPIKE VIAL GLASS SM (MISCELLANEOUS) ×2 IMPLANT
DRAPE C-ARM 42X120 X-RAY (DRAPES) ×2 IMPLANT
DRAPE UTILITY XL STRL (DRAPES) ×2 IMPLANT
DRAPE WARM FLUID 44X44 (DRAPES) ×2 IMPLANT
DRSG TEGADERM 2-3/8X2-3/4 SM (GAUZE/BANDAGES/DRESSINGS) ×4 IMPLANT
DRSG TEGADERM 4X4.75 (GAUZE/BANDAGES/DRESSINGS) ×2 IMPLANT
DRSG TELFA 3X8 NADH (GAUZE/BANDAGES/DRESSINGS) ×2 IMPLANT
ELECT REM PT RETURN 15FT ADLT (MISCELLANEOUS) ×2 IMPLANT
ENDOLOOP SUT PDS II  0 18 (SUTURE) ×2
ENDOLOOP SUT PDS II 0 18 (SUTURE) IMPLANT
GAUZE SPONGE 2X2 8PLY STRL LF (GAUZE/BANDAGES/DRESSINGS) IMPLANT
GLOVE ECLIPSE 8.0 STRL XLNG CF (GLOVE) ×2 IMPLANT
GLOVE INDICATOR 8.0 STRL GRN (GLOVE) ×2 IMPLANT
GOWN STRL REUS W/TWL XL LVL3 (GOWN DISPOSABLE) ×4 IMPLANT
IRRIG SUCT STRYKERFLOW 2 WTIP (MISCELLANEOUS) ×2
IRRIGATION SUCT STRKRFLW 2 WTP (MISCELLANEOUS) ×1 IMPLANT
KIT BASIN (CUSTOM PROCEDURE TRAY) ×2 IMPLANT
KIT TURNOVER KIT A (KITS) IMPLANT
NDL BIOPSY 14X6 SOFT TISS (NEEDLE) IMPLANT
NEEDLE BIOPSY 14X6 SOFT TISS (NEEDLE) ×2 IMPLANT
PAD DRESSING TELFA 3X8 NADH (GAUZE/BANDAGES/DRESSINGS) IMPLANT
PENCIL SMOKE EVACUATOR (MISCELLANEOUS) IMPLANT
POUCH RETRIEVAL ECOSAC 10 (ENDOMECHANICALS) ×1 IMPLANT
POUCH RETRIEVAL ECOSAC 10MM (ENDOMECHANICALS) ×2
SCISSORS LAP 5X35 DISP (ENDOMECHANICALS) ×2 IMPLANT
SET CHOLANGIOGRAPH MIX (MISCELLANEOUS) ×2 IMPLANT
SET TUBE SMOKE EVAC HIGH FLOW (TUBING) ×2 IMPLANT
SLEEVE XCEL OPT CAN 5 100 (ENDOMECHANICALS) ×3 IMPLANT
SPONGE GAUZE 2X2 STER 10/PKG (GAUZE/BANDAGES/DRESSINGS) ×1
SUT MNCRL AB 4-0 PS2 18 (SUTURE) ×2 IMPLANT
SYR 20ML LL LF (SYRINGE) ×2 IMPLANT
TOWEL OR 17X26 10 PK STRL BLUE (TOWEL DISPOSABLE) ×2 IMPLANT
TOWEL OR NON WOVEN STRL DISP B (DISPOSABLE) ×2 IMPLANT
TRAY LAPAROSCOPIC (CUSTOM PROCEDURE TRAY) ×2 IMPLANT
TROCAR BLADELESS OPT 5 100 (ENDOMECHANICALS) ×3 IMPLANT
TROCAR XCEL NON-BLD 11X100MML (ENDOMECHANICALS) ×2 IMPLANT

## 2020-02-04 NOTE — TOC Progression Note (Signed)
Transition of Care [facility]) - Progression Note    Patient Details  Name: Vickie Chang MRN: [id number] Date of Birth: 1945/11/20  [facility] CM/SW Contact  Purcell Mouton, RN Phone Number: 02/04/2020, 4:18 PM  Clinical Narrative:     Pt do not feel that she will need HH at present time. TOC signing off.  Expected Discharge Plan: Home/Self Care Barriers to Discharge: No Barriers Identified  Expected Discharge Plan and Services Expected Discharge Plan: Home/Self Care   Discharge Planning Services: CM Consult   Living arrangements for the past 2 months: Single Family Home                                       Social Determinants of Health (SDOH) Interventions    Readmission Risk Interventions No flowsheet data found.

## 2020-02-04 NOTE — Discharge Instructions (Signed)
[facility] ° °Please arrive at least 30 min before your appointment to complete your check in paperwork.  If you are unable to arrive 30 min prior to your appointment time we may have to cancel or reschedule you. °LAPAROSCOPIC SURGERY: POST OP INSTRUCTIONS °Always review your discharge instruction sheet given to you by the facility where your surgery was performed. °IF YOU HAVE DISABILITY OR FAMILY LEAVE FORMS, YOU MUST BRING THEM TO THE OFFICE FOR PROCESSING.   °DO NOT GIVE THEM TO YOUR DOCTOR. ° °PAIN CONTROL ° °1. First take acetaminophen (Tylenol) AND/or ibuprofen (Advil) to control your pain after surgery.  Follow directions on package.  Taking acetaminophen (Tylenol) and/or ibuprofen (Advil) regularly after surgery will help to control your pain and lower the amount of prescription pain medication you may need.  You should not take more than 4,000 mg (4 grams) of acetaminophen (Tylenol) in 24 hours.  You should not take ibuprofen (Advil), aleve, motrin, naprosyn or other NSAIDS if you have a history of stomach ulcers or chronic kidney disease.  °2. A prescription for pain medication may be given to you upon discharge.  Take your pain medication as prescribed, if you still have uncontrolled pain after taking acetaminophen (Tylenol) or ibuprofen (Advil). °3. Use ice packs to help control pain. °4. If you need a refill on your pain medication, please contact your pharmacy.  They will contact our office to request authorization. Prescriptions will not be filled after 5pm or on week-ends. ° °HOME MEDICATIONS °5. Take your usually prescribed medications unless otherwise directed. ° °DIET °6. You should follow a light diet the first few days after arrival home.  Be sure to include lots of fluids daily. Avoid fatty, fried foods.  ° °CONSTIPATION °7. It is common to experience some constipation after surgery and if you are taking pain medication.  Increasing fluid intake and taking a stool  softener (such as Colace) will usually help or prevent this problem from occurring.  A mild laxative (Milk of Magnesia or Miralax) should be taken according to package instructions if there are no bowel movements after 48 hours. ° °WOUND/INCISION CARE °8. Most patients will experience some swelling and bruising in the area of the incisions.  Ice packs will help.  Swelling and bruising can take several days to resolve.  °9. Unless discharge instructions indicate otherwise, follow guidelines below  °a. STERI-STRIPS - you may remove your outer bandages 48 hours after surgery, and you may shower at that time.  You have steri-strips (small skin tapes) in place directly over the incision.  These strips should be left on the skin for 7-10 days.   °b. DERMABOND/SKIN GLUE - you may shower in 24 hours.  The glue will flake off over the next 2-3 weeks. °10. Any sutures or staples will be removed at the office during your follow-up visit. ° °ACTIVITIES °11. You may resume regular (light) daily activities beginning the next day--such as daily self-care, walking, climbing stairs--gradually increasing activities as tolerated.  You may have sexual intercourse when it is comfortable.  Refrain from any heavy lifting or straining until approved by your doctor. °a. You may drive when you are no longer taking prescription pain medication, you can comfortably wear a seatbelt, and you can safely maneuver your car and apply brakes. ° °FOLLOW-UP °12. You should see your doctor in the office for a follow-up appointment approximately 2-3 weeks after your surgery.  You should have been given your post-op/follow-up appointment when   your surgery was scheduled.  If you did not receive a post-op/follow-up appointment, make sure that you call for this appointment within a day or two after you arrive home to insure a convenient appointment time. ° °OTHER INSTRUCTIONS ° °WHEN TO CALL YOUR DOCTOR: °1. Fever over 101.0 °2. Inability to  urinate °3. Continued bleeding from incision. °4. Increased pain, redness, or drainage from the incision. °5. Increasing abdominal pain ° °The clinic staff is available to answer your questions during regular business hours.  Please don’t hesitate to call and ask to speak to one of the nurses for clinical concerns.  If you have a medical emergency, go to the nearest emergency room or call 911.  A surgeon from [facility] is always on call at the hospital. °[address], [address], [place], [place]  [address] ? [address] [address], [place], [place]   [address] °[phone] ? [phone] ? FAX [phone] ° ° ° °

## 2020-02-04 NOTE — Anesthesia Procedure Notes (Signed)

## 2020-02-04 NOTE — Progress Notes (Signed)
Patient arrives to unit via stretcher, A&O x4 no s/s of acute distress noted. Single incision to umbilicalis dressing clean, dry and intact. Daughter at bedside. Call light in place. Will continue to monitor

## 2020-02-04 NOTE — Anesthesia Preprocedure Evaluation (Addendum)
Anesthesia Evaluation  Patient identified by MRN, date of birth, ID band Patient awake    Reviewed: Allergy & Precautions, NPO status , Patient's Chart, lab work & pertinent test results, reviewed documented beta blocker date and time   Airway Mallampati: II  TM Distance: >3 FB Neck ROM: Full    Dental  (+) Missing, Dental Advisory Given,    Pulmonary asthma ,    Pulmonary exam normal breath sounds clear to auscultation       Cardiovascular hypertension, Pt. on medications and Pt. on home beta blockers negative cardio ROS Normal cardiovascular exam Rhythm:Regular Rate:Normal  HLD  TEE 2016 Normal LV size with mild LV hypertrophy.  EF 55-60%.  Mild LAE, mild RAE.  No LAA thrombus.  Normal RV size and systolic function.  Trivial MR.  Trileaflet aortic valve with no AS, trivial AI. Trivial TR.  There was a small pericardial effusion predominantly along the RV free wall.  No ASD or PFO, negative bubble study.  The aorta was normal in caliber with mild plaque in the descending thoracic aorta.    Neuro/Psych PSYCHIATRIC DISORDERS Anxiety Depression TIA   GI/Hepatic Neg liver ROS, GERD  Medicated,  Endo/Other  diabetes, Well Controlled, Type 2Hypothyroidism   Renal/GU negative Renal ROS  negative genitourinary   Musculoskeletal negative musculoskeletal ROS (+)   Abdominal   Peds  Hematology negative hematology ROS (+)   Anesthesia Other Findings   Reproductive/Obstetrics                            Anesthesia Physical Anesthesia Plan  ASA: III  Anesthesia Plan: General   Post-op Pain Management:    Induction: Intravenous  PONV Risk Score and Plan: 3 and Midazolam, Dexamethasone and Ondansetron  Airway Management Planned: Oral ETT  Additional Equipment:   Intra-op Plan:   Post-operative Plan: Extubation in OR  Informed Consent: I have reviewed the patients History and Physical, chart,  labs and discussed the procedure including the risks, benefits and alternatives for the proposed anesthesia with the patient or authorized representative who has indicated his/her understanding and acceptance.     Dental advisory given  Plan Discussed with: CRNA  Anesthesia Plan Comments:         Anesthesia Quick Evaluation

## 2020-02-04 NOTE — Consult Note (Signed)
Referring Provider: Dr. Johney Maine (surgery) Primary Care Physician:  Harrison Mons, PA Primary Gastroenterologist:  Althia Forts  Reason for Consultation:  Positive intraoperative cholangiogram  HPI: Vickie Chang is a 74 y.o. female with past medical history noted below who underwent laparoscopic cholecystectomy today with IOC which revealed a filling defect in the distal CBD.  Patient has had intermittent nausea and vomiting for the past 1.5 years, which she describes as projectile.  She had not experienced abdominal pain until yesterday when she experienced severe epigastric pain which brought her to the emergency room.  She denies any hematemesis.  She did have some diarrhea over the last few days but denies any melena or hematochezia.  Denies fever, unexplained weight loss or recent changes in appetite.  Does have some recent intentional weight loss.  She has never had an EGD or colonoscopy.   Past Medical History:  Diagnosis Date  . Anxiety   . Central retinal artery occlusion, right eye    a. Dx 2016 - admitted with transient visual disturbance, had recently been dx with central retinal artery occlusion.  . Cough   . Depression   . Diet-controlled diabetes mellitus ([place])   . GERD (gastroesophageal reflux disease)   . Hyperlipidemia   . Hypertension   . Hypokalemia   . Hypothyroidism   . Intrinsic asthma   . Legionnaire's disease ([place])    1984    Past Surgical History:  Procedure Laterality Date  . TEE WITHOUT CARDIOVERSION N/A 05/26/2015   Procedure: TRANSESOPHAGEAL ECHOCARDIOGRAM (TEE);  Surgeon: Larey Dresser, MD;  Location: [place];  Service: Cardiovascular;  Laterality: N/A;  . TOTAL ABDOMINAL HYSTERECTOMY W/ BILATERAL SALPINGOOPHORECTOMY  06/27/15   and excision of umbilical hernia sac, performed by Dr. Fermin Schwab at [facility]    Prior to Admission medications   Medication Sig Start Date End Date Taking? Authorizing Provider  acetaminophen (TYLENOL)  325 MG tablet Take 650 mg by mouth every 6 (six) hours as needed.   Yes [provider]  albuterol (VENTOLIN HFA) 108 (90 Base) MCG/ACT inhaler Inhale 1 puff into the lungs every 6 (six) hours as needed for wheezing or shortness of breath.   Yes [provider]  ALPRAZolam (XANAX) 0.25 MG tablet Take 0.25 mg by mouth daily as needed for anxiety.    Yes [provider]  aspirin EC 81 MG tablet Take 81 mg by mouth daily.   Yes [provider]  atorvastatin (LIPITOR) 20 MG tablet Take 1 tablet (20 mg total) by mouth daily at 6 PM. Patient taking differently: Take 20 mg by mouth daily.  04/28/15  Yes TatShanon Brow, MD  Azelastine-Fluticasone 137-50 MCG/ACT SUSP Place 1 spray into both nostrils 2 (two) times daily. 01/13/20  Yes [provider]  buPROPion (WELLBUTRIN XL) 150 MG 24 hr tablet Take 450 mg by mouth daily. 01/07/20  Yes [provider]  buPROPion (ZYBAN) 150 MG 12 hr tablet Take 450 mg by mouth daily.   Yes [provider]  clotrimazole-betamethasone (LOTRISONE) cream Apply 1 application topically 2 (two) times daily as needed (for itching).  05/20/15  Yes [provider]  dexlansoprazole (DEXILANT) 60 MG capsule Take 60 mg by mouth daily.   Yes [provider]  Eszopiclone (ESZOPICLONE) 3 MG TABS Take 3 mg by mouth at bedtime. Take immediately before bedtime   Yes [provider]  famotidine (PEPCID) 20 MG tablet Take 40 mg by mouth 2 (two) times daily.    Yes [provider]  FLOVENT HFA 220 MCG/ACT inhaler Inhale 1 puff into the lungs daily.  05/20/15  Yes [provider]  ketotifen (ZADITOR) 0.025 % ophthalmic solution Place 1 drop into both eyes daily.   Yes [provider]  latanoprost (XALATAN) 0.005 % ophthalmic solution Place 1 drop into both eyes at bedtime.  06/18/15  Yes [provider]  levothyroxine (SYNTHROID) 88 MCG tablet Take 88 mcg by mouth daily. 01/07/20  Yes  [provider]  losartan (COZAAR) 100 MG tablet Take 1 tablet (100 mg total) by mouth daily. 04/28/15  Yes Tat, Shanon Brow, MD  Multiple Vitamins-Minerals (CENTRUM SILVER ULTRA WOMENS PO) Take 1 tablet by mouth daily.   Yes [provider]  nebivolol (BYSTOLIC) 5 MG tablet Take 1 tablet (5 mg total) by mouth daily. 04/28/15  Yes Tat, Shanon Brow, MD  ondansetron (ZOFRAN-ODT) 4 MG disintegrating tablet Take 4 mg by mouth in the morning, at noon, in the evening, and at bedtime.  06/18/15  Yes [provider]  potassium chloride SA (K-DUR,KLOR-CON) 20 MEQ tablet Take 60 mEq by mouth daily.   Yes [provider]  traMADol (ULTRAM) 50 MG tablet Take 50 mg by mouth daily as needed for moderate pain. 1 -2 every 4 hrs. As needed    Yes [provider]  XIIDRA 5 % SOLN Place 1 drop into both eyes at bedtime.  11/26/19  Yes [provider]    Scheduled Meds: . acetaminophen      . [MAR Hold] alum & mag hydroxide-simeth  30 mL Oral Once  . bupivacaine liposome  20 mL Infiltration Once  . [MAR Hold] fentaNYL (SUBLIMAZE) injection  100 mcg Intravenous Once  . labetalol      . [MAR Hold] pantoprazole (PROTONIX) IV  40 mg Intravenous Q24H  . zolpidem  5 mg Oral Once   Continuous Infusions: . sodium chloride 125 mL/hr at 02/04/20 0600  . [MAR Hold] ciprofloxacin 400 mg (02/04/20 0604)   And  . [MAR Hold] metronidazole 500 mg (02/04/20 0603)  . lactated ringers 50 mL/hr at 02/04/20 1042   PRN Meds:.0.9 % irrigation (POUR BTL), [MAR Hold] acetaminophen **OR** [MAR Hold] acetaminophen, [MAR Hold] alum & mag hydroxide-simeth, bupivacaine liposome, bupivacaine-epinephrine, fentaNYL (SUBLIMAZE) injection, [MAR Hold] HYDROcodone-acetaminophen, iopamidol, lactated ringers, [MAR Hold] metoprolol tartrate, [MAR Hold] ondansetron **OR** [MAR Hold] ondansetron (ZOFRAN) IV, [MAR Hold] sodium chloride  Allergies as of 02/03/2020 - Review Complete 02/03/2020  Allergen Reaction  Noted  . Sulfa antibiotics Hives 04/28/2015  . Tetanus toxoids Swelling 02/13/2016  . Codeine Other (See Comments)   . Penicillins Swelling   . Sulfonamide derivatives Hives 10/19/2007    Family History  Problem Relation Age of Onset  . Heart attack Father        died in his 57's of MI  . Stroke Father   . Asthma Sister     Social History   Socioeconomic History  . Marital status: Widowed    Spouse name: Not on file  . Number of children: Not on file  . Years of education: Not on file  . Highest education level: Not on file  Occupational History  . Not on file  Tobacco Use  . Smoking status: Never Smoker  . Smokeless tobacco: Never Used  Substance and Sexual Activity  . Alcohol use: No  . Drug use: No  . Sexual activity: Not on file  Other Topics Concern  . Not on file  Social History Narrative  . Not  on file   Social Determinants of Health   Financial Resource Strain:   . Difficulty of Paying Living Expenses:   Food Insecurity:   . Worried About Charity fundraiser in the Last Year:   . Arboriculturist in the Last Year:   Transportation Needs:   . Film/video editor (Medical):   Marland Kitchen Lack of Transportation (Non-Medical):   Physical Activity:   . Days of Exercise per Week:   . Minutes of Exercise per Session:   Stress:   . Feeling of Stress :   Social Connections:   . Frequency of Communication with Friends and Family:   . Frequency of Social Gatherings with Friends and Family:   . Attends Religious Services:   . Active Member of Clubs or Organizations:   . Attends Archivist Meetings:   Marland Kitchen Marital Status:   Intimate Partner Violence:   . Fear of Current or Ex-Partner:   . Emotionally Abused:   Marland Kitchen Physically Abused:   . Sexually Abused:     Review of Systems: Review of Systems  Constitutional: Positive for malaise/fatigue. Negative for chills, fever and weight loss.  HENT: Negative for hearing loss and tinnitus.   Eyes: Negative for  blurred vision and double vision.  Respiratory: Negative for cough and shortness of breath.   Cardiovascular: Negative for chest pain and palpitations.  Gastrointestinal: Positive for abdominal pain (epigastric, RUQ), diarrhea, nausea and vomiting. Negative for blood in stool, constipation, heartburn and melena.  Genitourinary: Negative for dysuria and hematuria.  Musculoskeletal: Negative for falls and myalgias.  Skin: Negative for itching and rash.  Neurological: Negative for seizures and loss of consciousness.  Endo/Heme/Allergies: Negative for polydipsia. Does not bruise/bleed easily.  Psychiatric/Behavioral: The patient has insomnia. The patient is not nervous/anxious.    Physical Exam: Vital signs: Vitals:   02/04/20 1515 02/04/20 1602  BP: (!) 176/81 (!) 208/87  Pulse: 70 75  Resp: 15 18  Temp: 97.8 F (36.6 C) 97.8 F (36.6 C)  SpO2: 95% 100%   Last BM Date: 02/02/20  Physical Exam  Constitutional: She is oriented to person, place, and time. She appears well-developed and well-nourished. No distress.  Daughter Elmyra Ricks at bedside  HENT:  Head: Normocephalic and atraumatic.  Eyes: Conjunctivae and EOM are normal. Scleral icterus is present.  Cardiovascular: Normal rate, regular rhythm and normal heart sounds.  Pulmonary/Chest: Effort normal and breath sounds normal. No respiratory distress.  Abdominal: Soft. Bowel sounds are normal. She exhibits no distension and no mass. There is abdominal tenderness (surrounding right laparoscopic incision). There is no rebound and no guarding.  Musculoskeletal:        General: No deformity or edema.     Cervical back: Normal range of motion and neck supple.  Neurological: She is alert and oriented to person, place, and time.  Skin: Skin is warm and dry.  Psychiatric: She has a normal mood and affect. Her behavior is normal.     GI:  Lab Results: Recent Labs    02/03/20 0512 02/04/20 0438  WBC 17.9* 13.2*  HGB 13.2 11.6*  HCT  38.4 35.3*  PLT 246 188   BMET Recent Labs    02/03/20 0513 02/04/20 0438  NA 141 142  K 3.1* 3.6  CL 103 107  CO2 27 25  GLUCOSE 153* 99  BUN 11 9  CREATININE 0.98 0.85  CALCIUM 8.9 8.4*   LFT Recent Labs    02/04/20 0438  PROT 6.2*  ALBUMIN 3.6  AST 284*  ALT 291*  ALKPHOS 107  BILITOT 3.9*   PT/INR No results for input(s): LABPROT, INR in the last 72 hours.   Studies/Results: CT Head Wo Contrast  Result Date: 02/03/2020 CLINICAL DATA:  Vomiting and dizziness.  Frontal headache EXAM: CT HEAD WITHOUT CONTRAST TECHNIQUE: Contiguous axial images were obtained from the base of the skull through the vertex without intravenous contrast. COMPARISON:  06/03/2015 FINDINGS: Brain: No evidence of acute infarction, hemorrhage, hydrocephalus, extra-axial collection or mass lesion/mass effect. 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 Vascular: Atherosclerotic calcification. Skull: Normal. Negative for fracture or focal lesion. Sinuses/Orbits: Bilateral cataract resection.  No acute finding. IMPRESSION: 1. No acute finding. 2. Extensive chronic small vessel ischemia in the cerebral white matter. Electronically Signed   By: Monte Fantasia M.D.   On: 02/03/2020 06:08   DG Chest Portable 1 View  Result Date: 02/03/2020 CLINICAL DATA:  Shortness of breath EXAM: PORTABLE CHEST 1 VIEW COMPARISON:  06/03/2015 FINDINGS: Better inflated than before. There is no edema, consolidation, effusion, or pneumothorax. Normal heart size and mediastinal contours. Artifact from EKG leads. IMPRESSION: Negative. Electronically Signed   By: Monte Fantasia M.D.   On: 02/03/2020 05:50   DG C-Arm 1-60 Min-No Report  Result Date: 02/04/2020 Fluoroscopy was utilized by the requesting physician.  No radiographic interpretation.   CT Angio Chest/Abd/Pel for Dissection W and/or Wo Contrast  Result Date: 02/03/2020 CLINICAL DATA:  Chest pain with nausea and vomiting. EXAM: CT  ANGIOGRAPHY CHEST, ABDOMEN AND PELVIS TECHNIQUE: Non-contrast CT of the chest was initially obtained. Multidetector CT imaging through the chest, abdomen and pelvis was performed using the standard protocol during bolus administration of intravenous contrast. Multiplanar reconstructed images and MIPs were obtained and reviewed to evaluate the vascular anatomy. CONTRAST:  137m OMNIPAQUE IOHEXOL 350 MG/ML SOLN COMPARISON:  Chest x-ray from same day. CT chest, abdomen, and pelvis dated June 03, 2015. FINDINGS: CTA CHEST FINDINGS Cardiovascular: Preferential opacification of the thoracic aorta. No evidence of thoracic aortic aneurysm or dissection. Mild atherosclerotic calcification of the aortic arch and great vessels. Normal heart size. Chronic trace pericardial effusion. No central pulmonary embolism. Mediastinum/Nodes: No enlarged mediastinal, hilar, or axillary lymph nodes. Unchanged 8 mm hypodense nodule small calcifications in the right thyroid lobe, stable since 2016. Not clinically significant; no follow-up imaging recommended. Trachea and esophagus demonstrate no significant findings. Lungs/Pleura: Lungs are clear. No pleural effusion or pneumothorax. Musculoskeletal: No chest wall abnormality. No acute or significant osseous findings. Chronic mild T10 and T12 superior endplate compression deformities. Review of the MIP images confirms the above findings. CTA ABDOMEN AND PELVIS FINDINGS VASCULAR Aorta: Normal caliber aorta without aneurysm, dissection, vasculitis or significant stenosis. Mild atherosclerotic calcification. Celiac: Patent without evidence of aneurysm, dissection, vasculitis or significant stenosis. SMA: Patent without evidence of aneurysm, dissection, vasculitis or significant stenosis. Renals: Both renal arteries are patent without evidence of aneurysm, dissection, vasculitis, fibromuscular dysplasia or significant stenosis. IMA: Patent without evidence of aneurysm, dissection,  vasculitis or significant stenosis. Inflow: Patent without evidence of aneurysm, dissection, vasculitis or significant stenosis. Veins: No obvious venous abnormality within the limitations of this arterial phase study. Review of the MIP images confirms the above findings. NON-VASCULAR Hepatobiliary: No focal liver abnormality is seen. Small gallstones. No gallbladder wall thickening. The common bile duct measures 7 mm, at the upper limits of normal. Pancreas: Unremarkable. No pancreatic ductal dilatation or surrounding inflammatory changes. Spleen: Normal in size without focal abnormality. Adrenals/Urinary  Tract: Adrenal glands are unremarkable. Kidneys are normal, without renal calculi, focal lesion, or hydronephrosis. Bladder is unremarkable. Stomach/Bowel: Small hiatal hernia. The stomach is otherwise within normal limits. No bowel wall thickening, distention, or surrounding inflammatory changes. Mild sigmoid diverticulosis. Normal appendix. Lymphatic: No enlarged abdominal or pelvic lymph nodes. Reproductive: Status post hysterectomy. No adnexal masses. Other: No abdominal wall hernia or abnormality. No abdominopelvic ascites. No pneumoperitoneum. Musculoskeletal: No acute or significant osseous findings. Review of the MIP images confirms the above findings. IMPRESSION: 1. No evidence of acute aortic syndrome or aneurysm. 2. No acute intrathoracic or intra-abdominal findings. 3. Cholelithiasis. 4. Aortic Atherosclerosis (ICD10-I70.0). Electronically Signed   By: Titus Dubin M.D.   On: 02/03/2020 06:44   US Abdomen Limited RUQ  Result Date: 02/03/2020 CLINICAL DATA:  Right upper quadrant abdominal pain. Cholelithiasis on CT from earlier today. EXAM: ULTRASOUND ABDOMEN LIMITED RIGHT UPPER QUADRANT COMPARISON:  02/03/2020 chest, abdomen and pelvis CT angiogram. FINDINGS: Gallbladder: There is a layering 1.5 cm gallstone in mildly distended gallbladder. No gallbladder wall thickening. No sonographic Murphy's  sign. No pericholecystic fluid. Common bile duct: Diameter: 6 mm Liver: Liver parenchyma is diffusely mildly echogenic. No definite liver surface irregularity. No liver masses, noting decreased sensitivity in the setting of an echogenic liver. Portal vein is patent on color Doppler imaging with normal direction of blood flow towards the liver. Other: None. IMPRESSION: 1. Cholelithiasis.  No evidence of acute cholecystitis. 2. Top-normal common bile duct diameter (6 mm). 3. Diffusely mildly echogenic liver parenchyma, nonspecific, which can be due to hepatic steatosis or fibrosis. Consider outpatient hepatic elastography for further liver fibrosis risk stratification, as clinically warranted. Electronically Signed   By: Ilona Sorrel M.D.   On: 02/03/2020 10:32    Impression: Choledocholithiasis per IOC 5/14 -T bili 3.9/AST 284/ALT 291/alk phos 107 -Lipase within normal limits (20) -s/p lap chole 5/14 for cholelithiasis  Plan: Plan for ERCP tomorrow.  The procedure, benefits, and risks (including but not limited to pancreatitis, infection, bleeding, perforation, anesthesia) were discussed with the patient.  Patient was given the opportunity to ask questions.  Patient and patient's daughter gave verbal consent to proceed with procedure.  Clears OK with NPO after midnight.  Ambien one-time dose ordered per patient request (takes Lunesta nightly at home).  Patient has never had a colonoscopy and should follow up as an outpatient to discuss screening colonoscopy.  [facility] will follow.    LOS: 1 day   Salley Moradi  PA-C 02/04/2020, 4:02 PM  Contact #  [phone]

## 2020-02-04 NOTE — Anesthesia Postprocedure Evaluation (Signed)
Anesthesia Post Note  Patient: Vickie Chang  Procedure(s) Performed: LAPAROSCOPIC CHOLECYSTECTOMY WITH INTRAOPERATIVE CHOLANGIOGRAM SINGLE SITE, CORE LIVER BIOPSY, BILATERAL TAP BLOCK (N/A )     Patient location during evaluation: PACU Anesthesia Type: General Level of consciousness: awake and alert Pain management: pain level controlled Vital Signs Assessment: post-procedure vital signs reviewed and stable Respiratory status: spontaneous breathing, nonlabored ventilation, respiratory function stable and patient connected to nasal cannula oxygen Cardiovascular status: blood pressure returned to baseline and stable Postop Assessment: no apparent nausea or vomiting Anesthetic complications: no    Last Vitals:  Vitals:   02/04/20 1345 02/04/20 1400  BP: (!) 180/79 (!) 174/86  Pulse: 80 83  Resp: 15 12  Temp: 36.5 C   SpO2: 99% 96%    Last Pain:  Vitals:   02/04/20 1400  TempSrc:   PainSc: 0-No pain                 Luke Falero

## 2020-02-04 NOTE — H&P (View-Only) (Signed)
Referring Provider: Dr. Johney Maine (surgery) Primary Care Physician:  Harrison Mons, PA Primary Gastroenterologist:  Althia Forts  Reason for Consultation:  Positive intraoperative cholangiogram  HPI: Vickie Chang is a 74 y.o. female with past medical history noted below who underwent laparoscopic cholecystectomy today with IOC which revealed a filling defect in the distal CBD.  Patient has had intermittent nausea and vomiting for the past 1.5 years, which she describes as projectile.  She had not experienced abdominal pain until yesterday when she experienced severe epigastric pain which brought her to the emergency room.  She denies any hematemesis.  She did have some diarrhea over the last few days but denies any melena or hematochezia.  Denies fever, unexplained weight loss or recent changes in appetite.  Does have some recent intentional weight loss.  She has never had an EGD or colonoscopy.   Past Medical History:  Diagnosis Date  . Anxiety   . Central retinal artery occlusion, right eye    a. Dx 2016 - admitted with transient visual disturbance, had recently been dx with central retinal artery occlusion.  . Cough   . Depression   . Diet-controlled diabetes mellitus ([place])   . GERD (gastroesophageal reflux disease)   . Hyperlipidemia   . Hypertension   . Hypokalemia   . Hypothyroidism   . Intrinsic asthma   . Legionnaire's disease ([place])    1984    Past Surgical History:  Procedure Laterality Date  . TEE WITHOUT CARDIOVERSION N/A 05/26/2015   Procedure: TRANSESOPHAGEAL ECHOCARDIOGRAM (TEE);  Surgeon: Larey Dresser, MD;  Location: [place];  Service: Cardiovascular;  Laterality: N/A;  . TOTAL ABDOMINAL HYSTERECTOMY W/ BILATERAL SALPINGOOPHORECTOMY  06/27/15   and excision of umbilical hernia sac, performed by Dr. Fermin Schwab at [facility]    Prior to Admission medications   Medication Sig Start Date End Date Taking? Authorizing Provider  acetaminophen (TYLENOL)  325 MG tablet Take 650 mg by mouth every 6 (six) hours as needed.   Yes [provider]  albuterol (VENTOLIN HFA) 108 (90 Base) MCG/ACT inhaler Inhale 1 puff into the lungs every 6 (six) hours as needed for wheezing or shortness of breath.   Yes [provider]  ALPRAZolam (XANAX) 0.25 MG tablet Take 0.25 mg by mouth daily as needed for anxiety.    Yes [provider]  aspirin EC 81 MG tablet Take 81 mg by mouth daily.   Yes [provider]  atorvastatin (LIPITOR) 20 MG tablet Take 1 tablet (20 mg total) by mouth daily at 6 PM. Patient taking differently: Take 20 mg by mouth daily.  04/28/15  Yes TatShanon Brow, MD  Azelastine-Fluticasone 137-50 MCG/ACT SUSP Place 1 spray into both nostrils 2 (two) times daily. 01/13/20  Yes [provider]  buPROPion (WELLBUTRIN XL) 150 MG 24 hr tablet Take 450 mg by mouth daily. 01/07/20  Yes [provider]  buPROPion (ZYBAN) 150 MG 12 hr tablet Take 450 mg by mouth daily.   Yes [provider]  clotrimazole-betamethasone (LOTRISONE) cream Apply 1 application topically 2 (two) times daily as needed (for itching).  05/20/15  Yes [provider]  dexlansoprazole (DEXILANT) 60 MG capsule Take 60 mg by mouth daily.   Yes [provider]  Eszopiclone (ESZOPICLONE) 3 MG TABS Take 3 mg by mouth at bedtime. Take immediately before bedtime   Yes [provider]  famotidine (PEPCID) 20 MG tablet Take 40 mg by mouth 2 (two) times daily.    Yes [provider]  FLOVENT HFA 220 MCG/ACT inhaler Inhale 1 puff into the lungs daily.  05/20/15  Yes [provider]  ketotifen (ZADITOR) 0.025 % ophthalmic solution Place 1 drop into both eyes daily.   Yes [provider]  latanoprost (XALATAN) 0.005 % ophthalmic solution Place 1 drop into both eyes at bedtime.  06/18/15  Yes [provider]  levothyroxine (SYNTHROID) 88 MCG tablet Take 88 mcg by mouth daily. 01/07/20  Yes  [provider]  losartan (COZAAR) 100 MG tablet Take 1 tablet (100 mg total) by mouth daily. 04/28/15  Yes Tat, Shanon Brow, MD  Multiple Vitamins-Minerals (CENTRUM SILVER ULTRA WOMENS PO) Take 1 tablet by mouth daily.   Yes [provider]  nebivolol (BYSTOLIC) 5 MG tablet Take 1 tablet (5 mg total) by mouth daily. 04/28/15  Yes Tat, Shanon Brow, MD  ondansetron (ZOFRAN-ODT) 4 MG disintegrating tablet Take 4 mg by mouth in the morning, at noon, in the evening, and at bedtime.  06/18/15  Yes [provider]  potassium chloride SA (K-DUR,KLOR-CON) 20 MEQ tablet Take 60 mEq by mouth daily.   Yes [provider]  traMADol (ULTRAM) 50 MG tablet Take 50 mg by mouth daily as needed for moderate pain. 1 -2 every 4 hrs. As needed    Yes [provider]  XIIDRA 5 % SOLN Place 1 drop into both eyes at bedtime.  11/26/19  Yes [provider]    Scheduled Meds: . acetaminophen      . [MAR Hold] alum & mag hydroxide-simeth  30 mL Oral Once  . bupivacaine liposome  20 mL Infiltration Once  . [MAR Hold] fentaNYL (SUBLIMAZE) injection  100 mcg Intravenous Once  . labetalol      . [MAR Hold] pantoprazole (PROTONIX) IV  40 mg Intravenous Q24H  . zolpidem  5 mg Oral Once   Continuous Infusions: . sodium chloride 125 mL/hr at 02/04/20 0600  . [MAR Hold] ciprofloxacin 400 mg (02/04/20 0604)   And  . [MAR Hold] metronidazole 500 mg (02/04/20 0603)  . lactated ringers 50 mL/hr at 02/04/20 1042   PRN Meds:.0.9 % irrigation (POUR BTL), [MAR Hold] acetaminophen **OR** [MAR Hold] acetaminophen, [MAR Hold] alum & mag hydroxide-simeth, bupivacaine liposome, bupivacaine-epinephrine, fentaNYL (SUBLIMAZE) injection, [MAR Hold] HYDROcodone-acetaminophen, iopamidol, lactated ringers, [MAR Hold] metoprolol tartrate, [MAR Hold] ondansetron **OR** [MAR Hold] ondansetron (ZOFRAN) IV, [MAR Hold] sodium chloride  Allergies as of 02/03/2020 - Review Complete 02/03/2020  Allergen Reaction  Noted  . Sulfa antibiotics Hives 04/28/2015  . Tetanus toxoids Swelling 02/13/2016  . Codeine Other (See Comments)   . Penicillins Swelling   . Sulfonamide derivatives Hives 10/19/2007    Family History  Problem Relation Age of Onset  . Heart attack Father        died in his 57's of MI  . Stroke Father   . Asthma Sister     Social History   Socioeconomic History  . Marital status: Widowed    Spouse name: Not on file  . Number of children: Not on file  . Years of education: Not on file  . Highest education level: Not on file  Occupational History  . Not on file  Tobacco Use  . Smoking status: Never Smoker  . Smokeless tobacco: Never Used  Substance and Sexual Activity  . Alcohol use: No  . Drug use: No  . Sexual activity: Not on file  Other Topics Concern  . Not on file  Social History Narrative  . Not  on file   Social Determinants of Health   Financial Resource Strain:   . Difficulty of Paying Living Expenses:   Food Insecurity:   . Worried About Charity fundraiser in the Last Year:   . Arboriculturist in the Last Year:   Transportation Needs:   . Film/video editor (Medical):   Marland Kitchen Lack of Transportation (Non-Medical):   Physical Activity:   . Days of Exercise per Week:   . Minutes of Exercise per Session:   Stress:   . Feeling of Stress :   Social Connections:   . Frequency of Communication with Friends and Family:   . Frequency of Social Gatherings with Friends and Family:   . Attends Religious Services:   . Active Member of Clubs or Organizations:   . Attends Archivist Meetings:   Marland Kitchen Marital Status:   Intimate Partner Violence:   . Fear of Current or Ex-Partner:   . Emotionally Abused:   Marland Kitchen Physically Abused:   . Sexually Abused:     Review of Systems: Review of Systems  Constitutional: Positive for malaise/fatigue. Negative for chills, fever and weight loss.  HENT: Negative for hearing loss and tinnitus.   Eyes: Negative for  blurred vision and double vision.  Respiratory: Negative for cough and shortness of breath.   Cardiovascular: Negative for chest pain and palpitations.  Gastrointestinal: Positive for abdominal pain (epigastric, RUQ), diarrhea, nausea and vomiting. Negative for blood in stool, constipation, heartburn and melena.  Genitourinary: Negative for dysuria and hematuria.  Musculoskeletal: Negative for falls and myalgias.  Skin: Negative for itching and rash.  Neurological: Negative for seizures and loss of consciousness.  Endo/Heme/Allergies: Negative for polydipsia. Does not bruise/bleed easily.  Psychiatric/Behavioral: The patient has insomnia. The patient is not nervous/anxious.    Physical Exam: Vital signs: Vitals:   02/04/20 1515 02/04/20 1602  BP: (!) 176/81 (!) 208/87  Pulse: 70 75  Resp: 15 18  Temp: 97.8 F (36.6 C) 97.8 F (36.6 C)  SpO2: 95% 100%   Last BM Date: 02/02/20  Physical Exam  Constitutional: She is oriented to person, place, and time. She appears well-developed and well-nourished. No distress.  Daughter Vickie Chang at bedside  HENT:  Head: Normocephalic and atraumatic.  Eyes: Conjunctivae and EOM are normal. Scleral icterus is present.  Cardiovascular: Normal rate, regular rhythm and normal heart sounds.  Pulmonary/Chest: Effort normal and breath sounds normal. No respiratory distress.  Abdominal: Soft. Bowel sounds are normal. She exhibits no distension and no mass. There is abdominal tenderness (surrounding right laparoscopic incision). There is no rebound and no guarding.  Musculoskeletal:        General: No deformity or edema.     Cervical back: Normal range of motion and neck supple.  Neurological: She is alert and oriented to person, place, and time.  Skin: Skin is warm and dry.  Psychiatric: She has a normal mood and affect. Her behavior is normal.     GI:  Lab Results: Recent Labs    02/03/20 0512 02/04/20 0438  WBC 17.9* 13.2*  HGB 13.2 11.6*  HCT  38.4 35.3*  PLT 246 188   BMET Recent Labs    02/03/20 0513 02/04/20 0438  NA 141 142  K 3.1* 3.6  CL 103 107  CO2 27 25  GLUCOSE 153* 99  BUN 11 9  CREATININE 0.98 0.85  CALCIUM 8.9 8.4*   LFT Recent Labs    02/04/20 0438  PROT 6.2*  ALBUMIN 3.6  AST 284*  ALT 291*  ALKPHOS 107  BILITOT 3.9*   PT/INR No results for input(s): LABPROT, INR in the last 72 hours.   Studies/Results: CT Head Wo Contrast  Result Date: 02/03/2020 CLINICAL DATA:  Vomiting and dizziness.  Frontal headache EXAM: CT HEAD WITHOUT CONTRAST TECHNIQUE: Contiguous axial images were obtained from the base of the skull through the vertex without intravenous contrast. COMPARISON:  06/03/2015 FINDINGS: Brain: No evidence of acute infarction, hemorrhage, hydrocephalus, extra-axial collection or mass lesion/mass effect. 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 Vascular: Atherosclerotic calcification. Skull: Normal. Negative for fracture or focal lesion. Sinuses/Orbits: Bilateral cataract resection.  No acute finding. IMPRESSION: 1. No acute finding. 2. Extensive chronic small vessel ischemia in the cerebral white matter. Electronically Signed   By: Monte Fantasia M.D.   On: 02/03/2020 06:08   DG Chest Portable 1 View  Result Date: 02/03/2020 CLINICAL DATA:  Shortness of breath EXAM: PORTABLE CHEST 1 VIEW COMPARISON:  06/03/2015 FINDINGS: Better inflated than before. There is no edema, consolidation, effusion, or pneumothorax. Normal heart size and mediastinal contours. Artifact from EKG leads. IMPRESSION: Negative. Electronically Signed   By: Monte Fantasia M.D.   On: 02/03/2020 05:50   DG C-Arm 1-60 Min-No Report  Result Date: 02/04/2020 Fluoroscopy was utilized by the requesting physician.  No radiographic interpretation.   CT Angio Chest/Abd/Pel for Dissection W and/or Wo Contrast  Result Date: 02/03/2020 CLINICAL DATA:  Chest pain with nausea and vomiting. EXAM: CT  ANGIOGRAPHY CHEST, ABDOMEN AND PELVIS TECHNIQUE: Non-contrast CT of the chest was initially obtained. Multidetector CT imaging through the chest, abdomen and pelvis was performed using the standard protocol during bolus administration of intravenous contrast. Multiplanar reconstructed images and MIPs were obtained and reviewed to evaluate the vascular anatomy. CONTRAST:  137m OMNIPAQUE IOHEXOL 350 MG/ML SOLN COMPARISON:  Chest x-ray from same day. CT chest, abdomen, and pelvis dated June 03, 2015. FINDINGS: CTA CHEST FINDINGS Cardiovascular: Preferential opacification of the thoracic aorta. No evidence of thoracic aortic aneurysm or dissection. Mild atherosclerotic calcification of the aortic arch and great vessels. Normal heart size. Chronic trace pericardial effusion. No central pulmonary embolism. Mediastinum/Nodes: No enlarged mediastinal, hilar, or axillary lymph nodes. Unchanged 8 mm hypodense nodule small calcifications in the right thyroid lobe, stable since 2016. Not clinically significant; no follow-up imaging recommended. Trachea and esophagus demonstrate no significant findings. Lungs/Pleura: Lungs are clear. No pleural effusion or pneumothorax. Musculoskeletal: No chest wall abnormality. No acute or significant osseous findings. Chronic mild T10 and T12 superior endplate compression deformities. Review of the MIP images confirms the above findings. CTA ABDOMEN AND PELVIS FINDINGS VASCULAR Aorta: Normal caliber aorta without aneurysm, dissection, vasculitis or significant stenosis. Mild atherosclerotic calcification. Celiac: Patent without evidence of aneurysm, dissection, vasculitis or significant stenosis. SMA: Patent without evidence of aneurysm, dissection, vasculitis or significant stenosis. Renals: Both renal arteries are patent without evidence of aneurysm, dissection, vasculitis, fibromuscular dysplasia or significant stenosis. IMA: Patent without evidence of aneurysm, dissection,  vasculitis or significant stenosis. Inflow: Patent without evidence of aneurysm, dissection, vasculitis or significant stenosis. Veins: No obvious venous abnormality within the limitations of this arterial phase study. Review of the MIP images confirms the above findings. NON-VASCULAR Hepatobiliary: No focal liver abnormality is seen. Small gallstones. No gallbladder wall thickening. The common bile duct measures 7 mm, at the upper limits of normal. Pancreas: Unremarkable. No pancreatic ductal dilatation or surrounding inflammatory changes. Spleen: Normal in size without focal abnormality. Adrenals/Urinary  Tract: Adrenal glands are unremarkable. Kidneys are normal, without renal calculi, focal lesion, or hydronephrosis. Bladder is unremarkable. Stomach/Bowel: Small hiatal hernia. The stomach is otherwise within normal limits. No bowel wall thickening, distention, or surrounding inflammatory changes. Mild sigmoid diverticulosis. Normal appendix. Lymphatic: No enlarged abdominal or pelvic lymph nodes. Reproductive: Status post hysterectomy. No adnexal masses. Other: No abdominal wall hernia or abnormality. No abdominopelvic ascites. No pneumoperitoneum. Musculoskeletal: No acute or significant osseous findings. Review of the MIP images confirms the above findings. IMPRESSION: 1. No evidence of acute aortic syndrome or aneurysm. 2. No acute intrathoracic or intra-abdominal findings. 3. Cholelithiasis. 4. Aortic Atherosclerosis (ICD10-I70.0). Electronically Signed   By: Titus Dubin M.D.   On: 02/03/2020 06:44   US Abdomen Limited RUQ  Result Date: 02/03/2020 CLINICAL DATA:  Right upper quadrant abdominal pain. Cholelithiasis on CT from earlier today. EXAM: ULTRASOUND ABDOMEN LIMITED RIGHT UPPER QUADRANT COMPARISON:  02/03/2020 chest, abdomen and pelvis CT angiogram. FINDINGS: Gallbladder: There is a layering 1.5 cm gallstone in mildly distended gallbladder. No gallbladder wall thickening. No sonographic Murphy's  sign. No pericholecystic fluid. Common bile duct: Diameter: 6 mm Liver: Liver parenchyma is diffusely mildly echogenic. No definite liver surface irregularity. No liver masses, noting decreased sensitivity in the setting of an echogenic liver. Portal vein is patent on color Doppler imaging with normal direction of blood flow towards the liver. Other: None. IMPRESSION: 1. Cholelithiasis.  No evidence of acute cholecystitis. 2. Top-normal common bile duct diameter (6 mm). 3. Diffusely mildly echogenic liver parenchyma, nonspecific, which can be due to hepatic steatosis or fibrosis. Consider outpatient hepatic elastography for further liver fibrosis risk stratification, as clinically warranted. Electronically Signed   By: Ilona Sorrel M.D.   On: 02/03/2020 10:32    Impression: Choledocholithiasis per IOC 5/14 -T bili 3.9/AST 284/ALT 291/alk phos 107 -Lipase within normal limits (20) -s/p lap chole 5/14 for cholelithiasis  Plan: Plan for ERCP tomorrow.  The procedure, benefits, and risks (including but not limited to pancreatitis, infection, bleeding, perforation, anesthesia) were discussed with the patient.  Patient was given the opportunity to ask questions.  Patient and patient's daughter gave verbal consent to proceed with procedure.  Clears OK with NPO after midnight.  Ambien one-time dose ordered per patient request (takes Lunesta nightly at home).  Patient has never had a colonoscopy and should follow up as an outpatient to discuss screening colonoscopy.  [facility] will follow.    LOS: 1 day   Salley Moradi  PA-C 02/04/2020, 4:02 PM  Contact #  [phone]

## 2020-02-04 NOTE — Op Note (Signed)
02/04/2020  PATIENT:  Vickie Chang  74 y.o. female  Patient Care Team: Harrison Mons, [place] as PCP - General (Family Medicine) Tanda Rockers, MD as Consulting Physician (Pulmonary Disease) Marti Sleigh, MD as Consulting Physician (Gynecologic Oncology)  PRE-OPERATIVE DIAGNOSIS:    Acute on Chronic Calculus Cholecystitis  Probable gallstone pancreatitis with hyperbilirubinemia Fatty liver change, possible steatohepatitis  POST-OPERATIVE DIAGNOSIS:   Acute on Chronic Calculus Cholecystitis Probable gallstone pancreatitis with ampullary swelling, probable choledocholithiasis Liver: Fatty steatohepatitis  PROCEDURE:  SINGLE SITE Laparoscopic cholecystectomy Intraoperative cholangiogram  Core LIver biopsies x 3 TAP block - bilateral  SURGEON:  Adin Hector, MD, FACS.  ASSISTANT: OR Staff  ANESTHESIA:    General with endotracheal intubation Local anesthetic as a field block  EBL:  (See Anesthesia Intraoperative Record) Total I/O In: 150 [IV Piggyback:150] Out: -   Delay start of Pharmacological VTE agent (>24hrs) due to surgical blood loss or risk of bleeding:  no  DRAINS: None   SPECIMEN: Gallbladder & Core liver biopsies    DISPOSITION OF SPECIMEN:  PATHOLOGY  COUNTS:  YES  PLAN OF CARE: Admit to inpatient   PATIENT DISPOSITION:  PACU - hemodynamically stable.  INDICATION: Woman with abdominal pain with elevated liver function tests and probable cholecystitis.  Admitted.  Increased liver function test.  I recommended cholecystectomy with cholangiogram.  Patient with some possible fibrotic changes to the liver.  Plan core liver biopsy as well  The anatomy & physiology of hepatobiliary & pancreatic function was discussed.  The pathophysiology of gallbladder dysfunction was discussed.  Natural history risks without surgery was discussed.   I feel the risks of no intervention will lead to serious problems that outweigh the operative risks; therefore, I  recommended cholecystectomy to remove the pathology.  I explained laparoscopic techniques with possible need for an open approach.  Probable cholangiogram to evaluate the bilary tract was explained as well.    Risks such as bleeding, infection, abscess, leak, injury to other organs, need for further treatment, heart attack, death, and other risks were discussed.  I noted a good likelihood this will help address the problem.  Possibility that this will not correct all abdominal symptoms was explained.  Goals of post-operative recovery were discussed as well.  We will work to minimize complications.  An educational handout further explaining the pathology and treatment options was given as well.  Questions were answered.  The patient expresses understanding & wishes to proceed with surgery.  OR FINDINGS: Dilated gallbladder with edema consistent with acute on chronic cholecystitis with at least one moderate sized gallstone in the infundibulum.  Cholangiogram reveals dilated intra and extrahepatic biliary system with distal common bile duct narrowing and reverse meniscus sign suspicious for choledocholithiasis.  Discussed with Dr. Jacqulynn Cadet with interventional radiology who suspects a distal choledocholithiasis as well.  Not completely obstructing as some contrast eventually trickles into the duodenum.  Fatty change in the liver suspicious for steatohepatitis.  Core liver biopsies done.  DESCRIPTION:   The patient was identified & brought in the operating room. The patient was positioned supine with arms tucked. SCDs were active during the entire case. The patient underwent general anesthesia without any difficulty.  The abdomen was prepped and draped in a sterile fashion. A Surgical Timeout confirmed our plan.  I made a transverse curvilinear incision through the superior umbilical fold.  I placed a 52m long port through the supraumbilical fascia using a modified Hassan cutdown technique with  umbilical stalk fascial countertraction.  I began carbon dioxide insufflation.  No change in end tidal CO2 measurement.   Camera inspection revealed no injury. There were no adhesions to the anterior abdominal wall supraumbilically.  I proceeded to continue with single site technique. I placed a #5 port in left upper aspect of the wound. I placed a 5 mm atraumatic grasper in the right inferior aspect of the wound.  I turned attention to the right upper quadrant.  Gallbladder was turgid and dilated with edema consistent with acute on chronic cholecystitis.  However was able to grab the gallbladder fundus and elevate it cephalad.  I freed the peritoneal coverings between the gallbladder and the liver on the posteriolateral and anteriomedial walls. I alternated between Harmonic & blunt Maryland dissection to help get a good critical view of the cystic artery and cystic duct.  did further dissection to free 80%of the gallbladder off the liver bed to get a good critical view of the infundibulum and cystic duct. I dissected out the cystic artery; and, after getting a good 360 view, ligated the anterior & posterior branches of the cystic artery close on the infundibulum using the Harmonic ultrasonic dissection.  I skeletonized the cystic duct.  I placed a clip on the infundibulum. I did a partial cystic duct-otomy and ensured patency. I placed a 5 [place] cholangiocatheter through a puncture site at the right subcostal ridge of the abdominal wall and directed it into the cystic duct.  We ran a cholangiogram with dilute radio-opaque contrast and continuous fluoroscopy. Contrast flowed from a side branch consistent with cystic duct cannulization. Contrast flowed up the common hepatic duct into the right and left intrahepatic chains out to secondary radicals. Contrast flowed down the common bile duct.  Gradually.  The entire biliary system intra extrahepatic was mildly dilated.  Initially encountered a reverse meniscus  sign in the distal common bile duct.  Some trickling wisps of contrast around and into the duodenum.  Suspicious for choledocholithiasis.  No evidence of any leak.  Some ampullary thickening/swelling suspected.    I removed the cholangiocatheter.  I freed the gallbladder from remaining attachments on the liver.  It was quite intrahepatic so ended up taking a small wedge of the liver to get it out.  Ended up decompressing the gallbladder and aspirating mode of oral consistent thickened bile.  I then used a 0 PDS Endoloop to lasso around the gallbladder and ligate the cystic duct proximally at the cystic/common bile duct junction for a good ligation.  I then I placed clips on the cystic duct x4.   I completed cystic duct transection.  I placed the gallbladder inside an Endo Catch bag.  I ensured hemostasis on the gallbladder fossa of the liver and elsewhere. I inspected the rest of the abdomen & detected no injury nor bleeding elsewhere.  I then proceeded to do liver biopsy since there was concerns by radiology as well as on visual inspection that the liver had at least some fatty change.  Abnormal liver enzymes.  Used a 14-gauge Tru-Cut core needle biopsy through the right subcostal puncture site of the cholangiogram.  I did 3 passes into the anterior right hepatic lobe and got 2 excellent and 1 good core.  Assured hemostasis on the liver bed.  I did copious irrigation and ensured hemostasis on the gallbladder hepatic fossa.  This was good.  Clips and ligation intact on the cystic duct stump.  No bleeding or bile.    I removed the gallbladder out the  supraumbilical fascia.   I closed the fascia transversely using #1 PDS interrupted stitches. I closed the skin using 4-0 monocryl stitch.  Sterile dressing was applied. The patient was extubated & arrived in the PACU in stable condition..  I had discussed postoperative care with the patient in the holding area. I discussed operative findings, updated the  patient's status, discussed probable steps to recovery, and gave postoperative recommendations to the patient's daughter, Luna Kitchens.  Recommendations were made.  Questions were answered.  She expressed understanding & appreciation.  I reached out to gastroenterology and am waiting to hear back on consultation to see if the patient would benefit from ERCP for probable distal choledocholithiasis.  Adin Hector, M.D., F.A.C.S. Gastrointestinal and Minimally Invasive Surgery [facility] [address] [place], [place] [address] [phone] Main / Paging  02/04/2020 1:35 PM

## 2020-02-04 NOTE — Progress Notes (Signed)
PROGRESS NOTE  Vickie Chang QZE:[id number] DOB: 02-09-1946 DOA: 02/03/2020 PCP: Harrison Mons, [place] Hospital Course/Subjective: Vickie Chang is a 74 y.o. 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  Pain is described as sharp stabbing with radiation to the back, started suddenly, constant in nature and relieved with vomiting.She had associated nausea vomiting flatus, heart burn. She c/o fatigue /tiredness and has been having nausea and vomiting once a week. She was found on 5/13 at [facility] to have gallstone pancreatitis and cholelithiasis without evidence of cholecystitis.   Overnight she has had some nausea, pain is well controlled. NPO this AM anticipating surgery.  Assessment/Plan: Gallstone pancreatitis/Symptomatic cholelithiasis:Epigastric abdominal pain: With abnormal AST/ALT up today but lipase down to 20.  Serial troponin negative, suspecting gallstone pancreatitis.  CCS is on consult await further input- anticipating cholecystectomy, discussed with surgery PA. Keep n.p.o., IV fluid hydration, iv cipro/flagyl. Currently pain is controlled.  Hypokalemia was replaced. Resolved.  Leucocytosis likely from #1. Mild temp. S/p cipro/flagyl In ED,and will continue the same.  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  Anxiety/depression-on Xanax and Wellbutrin, resume slowly.  DM diet controlled: Monitor sugar.  Last hba1c 6.2 in 2016.  GERD- add ppi iv for now.  HTN: Blood pressure currently stable.Hold home meds.  HLD: Hold statin in the setting of #1.  Hypothyroidism: Resume Synthroid.  DVT Prophylaxis: Lovenox  Code Status: FULL    Objective: Vitals:   02/03/20  1230 02/03/20 1401 02/03/20 2133 02/04/20 0507  BP: 131/64 140/71 (!) 168/79 (!) 170/70  Pulse: 100 97 80 79  Resp: (!) 21 17 17 20   Temp: 100.3 F (37.9 C) 98.4 F (36.9 C) 98.9 F (37.2 C) 98.4 F (36.9 C)  TempSrc: Oral Oral Oral Oral  SpO2: 95% 98% 99% 98%  Weight:      Height:        Intake/Output Summary (Last 24 hours) at 02/04/2020 2263 Last data filed at 02/03/2020 1702 Gross per 24 hour  Intake 562.89 ml  Output --  Net 562.89 ml   Filed Weights   02/03/20 0516  Weight: 65 kg     Exam: General:  Alert, oriented, calm, in no acute distress Eyes: EOMI, clear sclerea Neck: supple, no masses, trachea mildline  Cardiovascular: RRR, no murmurs or rubs, no peripheral edema  Respiratory: clear to auscultation bilaterally, no wheezes, no crackles  Abdomen: soft, RUQ tender, nondistended, normal bowel tones heard  Skin: dry, no rashes  Musculoskeletal: no joint effusions, normal range of motion  Psychiatric: appropriate affect, normal speech  Neurologic: extraocular muscles intact, clear speech, moving all extremities with intact sensorium    Data Reviewed: CBC: Recent Labs  Lab 02/03/20 0512 02/04/20 0438  WBC 17.9* 13.2*  NEUTROABS 16.9*  --   HGB 13.2 11.6*  HCT 38.4 35.3*  MCV 84.4 87.4  PLT 246 335   Basic Metabolic Panel: Recent Labs  Lab 02/03/20 0513 02/04/20 0438  NA 141 142  K 3.1* 3.6  CL 103 107  CO2 27 25  GLUCOSE 153* 99  BUN 11 9  CREATININE 0.98 0.85  CALCIUM 8.9 8.4*   GFR: Estimated Creatinine Clearance: 50.3 mL/min (by C-G formula based on SCr of 0.85 mg/dL). Liver  Function Tests: Recent Labs  Lab 02/03/20 0513 02/04/20 0438  AST 181* 284*  ALT 94* 291*  ALKPHOS 110 107  BILITOT 1.2 3.9*  PROT 6.9 6.2*  ALBUMIN 4.0 3.6   Recent Labs  Lab 02/03/20 0513 02/04/20 0438  LIPASE 81* 20   No results for input(s): AMMONIA in the last 168 hours. Coagulation Profile: No results for input(s): INR, PROTIME in the last 168  hours. Cardiac Enzymes: No results for input(s): CKTOTAL, CKMB, CKMBINDEX, TROPONINI in the last 168 hours. BNP (last 3 results) No results for input(s): PROBNP in the last 8760 hours. HbA1C: No results for input(s): HGBA1C in the last 72 hours. CBG: Recent Labs  Lab 02/03/20 1352  GLUCAP 190*   Lipid Profile: No results for input(s): CHOL, HDL, LDLCALC, TRIG, CHOLHDL, LDLDIRECT in the last 72 hours. Thyroid Function Tests: No results for input(s): TSH, T4TOTAL, FREET4, T3FREE, THYROIDAB in the last 72 hours. Anemia Panel: No results for input(s): VITAMINB12, FOLATE, FERRITIN, TIBC, IRON, RETICCTPCT in the last 72 hours. Urine analysis: No results found for: COLORURINE, APPEARANCEUR, LABSPEC, PHURINE, GLUCOSEU, HGBUR, BILIRUBINUR, KETONESUR, PROTEINUR, UROBILINOGEN, NITRITE, LEUKOCYTESUR Sepsis Labs: @LABRCNTIP (procalcitonin:4,lacticidven:4)  ) Recent Results (from the past 240 hour(s))  SARS Coronavirus 2 by RT PCR (hospital order, performed in [facility] hospital lab) Nasopharyngeal Nasopharyngeal Swab     Status: None   Collection Time: 02/03/20  5:13 AM   Specimen: Nasopharyngeal Swab  Result Value Ref Range Status   SARS Coronavirus 2 NEGATIVE NEGATIVE Final    Comment: (NOTE) SARS-CoV-2 target nucleic acids are NOT DETECTED. The SARS-CoV-2 RNA is generally detectable in upper and lower respiratory specimens during the acute phase of infection. The lowest concentration of SARS-CoV-2 viral copies this assay can detect is 250 copies / mL. A negative result does not preclude SARS-CoV-2 infection and should not be used as the sole basis for treatment or other patient management decisions.  A negative result may occur with improper specimen collection / handling, submission of specimen other than nasopharyngeal swab, presence of viral mutation(s) within the areas targeted by this assay, and inadequate number of viral copies (<250 copies / mL). A negative result must be  combined with clinical observations, patient history, and epidemiological information. Fact Sheet for Patients:   StrictlyIdeas.no Fact Sheet for Healthcare Providers: BankingDealers.co.za This test is not yet approved or cleared  by the [place] FDA and has been authorized for detection and/or diagnosis of SARS-CoV-2 by FDA under an Emergency Use Authorization (EUA).  This EUA will remain in effect (meaning this test can be used) for the duration of the COVID-19 declaration under Section 564(b)(1) of the Act, 21 U.S.C. section 360bbb-3(b)(1), unless the authorization is terminated or revoked sooner. Performed at [facility], [address], [place], [place] [address]   Surgical PCR screen     Status: Abnormal   Collection Time: 02/04/20  4:59 AM   Specimen: Nasal Mucosa; Nasal Swab  Result Value Ref Range Status   MRSA, PCR POSITIVE (A) NEGATIVE Final    Comment: RESULT CALLED TO, READ BACK BY AND VERIFIED WITH: WAY,H. RN @0711  ON 5.14.2021 BY [email or web address]    Staphylococcus aureus POSITIVE (A) NEGATIVE Final    Comment: (NOTE) The Xpert SA Assay (FDA approved for NASAL specimens in patients 47 years of age and older), is one component of a comprehensive surveillance program. It is not intended to diagnose infection nor to guide or monitor treatment. Performed at [facility], [place]  [address], [place],  [address]      Studies: US Abdomen Limited RUQ  Result Date: 02/03/2020 CLINICAL DATA:  Right upper quadrant abdominal pain. Cholelithiasis on CT from earlier today. EXAM: ULTRASOUND ABDOMEN LIMITED RIGHT UPPER QUADRANT COMPARISON:  02/03/2020 chest, abdomen and pelvis CT angiogram. FINDINGS: Gallbladder: There is a layering 1.5 cm gallstone in mildly distended gallbladder. No gallbladder wall thickening. No sonographic Murphy's sign. No pericholecystic fluid. Common bile duct: Diameter: 6 mm Liver:  Liver parenchyma is diffusely mildly echogenic. No definite liver surface irregularity. No liver masses, noting decreased sensitivity in the setting of an echogenic liver. Portal vein is patent on color Doppler imaging with normal direction of blood flow towards the liver. Other: None. IMPRESSION: 1. Cholelithiasis.  No evidence of acute cholecystitis. 2. Top-normal common bile duct diameter (6 mm). 3. Diffusely mildly echogenic liver parenchyma, nonspecific, which can be due to hepatic steatosis or fibrosis. Consider outpatient hepatic elastography for further liver fibrosis risk stratification, as clinically warranted. Electronically Signed   By: Ilona Sorrel M.D.   On: 02/03/2020 10:32    Scheduled Meds: . alum & mag hydroxide-simeth  30 mL Oral Once  . fentaNYL (SUBLIMAZE) injection  100 mcg Intravenous Once  . heparin  5,000 Units Subcutaneous Q8H  . pantoprazole (PROTONIX) IV  40 mg Intravenous Q24H    Continuous Infusions: . sodium chloride 125 mL/hr at 02/04/20 0600  . ciprofloxacin 400 mg (02/04/20 0604)   And  . metronidazole 500 mg (02/04/20 0603)     LOS: 1 day   Time spent: 23 minutes  Mical Brun Marry Guan, MD [facility] Pager [phone]  If 7PM-7AM, please contact night-coverage [email or web address] Password [facility] 02/04/2020, 8:12 AM

## 2020-02-04 NOTE — Progress Notes (Signed)
Awaiting callback from physician regarding pt current bp status. Asymptomatic denies headache, blurred vision or any acute changes at this time

## 2020-02-04 NOTE — Transfer of Care (Signed)
Immediate Anesthesia Transfer of Care Note  Patient: Vickie Chang  Procedure(s) Performed: LAPAROSCOPIC CHOLECYSTECTOMY WITH INTRAOPERATIVE CHOLANGIOGRAM SINGLE SITE, CORE LIVER BIOPSY, BILATERAL TAP BLOCK (N/A )  Patient Location: PACU  Anesthesia Type:General  Level of Consciousness: awake, alert  and patient cooperative  Airway & Oxygen Therapy: Patient Spontanous Breathing and Patient connected to face mask oxygen  Post-op Assessment: Report given to RN and Post -op Vital signs reviewed and stable  Post vital signs: Reviewed and stable  Last Vitals:  Vitals Value Taken Time  BP 190/83 02/04/20 1350  Temp 36.5 C 02/04/20 1345  Pulse 79 02/04/20 1350  Resp 15 02/04/20 1350  SpO2 100 % 02/04/20 1350  Vitals shown include unvalidated device data.  Last Pain:  Vitals:   02/04/20 1345  TempSrc:   PainSc: Asleep      Patients Stated Pain Goal: 2 (80/88/11 0315)  Complications: No apparent anesthesia complications

## 2020-02-04 NOTE — Interval H&P Note (Signed)
History and Physical Interval Note:  02/04/2020 11:21 AM  Vickie Chang  has presented today for surgery, with the diagnosis of Cholelithiasis; Acute cholecystitis, gallstone pancreatitis.  The various methods of treatment have been discussed with the patient and family. After consideration of risks, benefits and other options for treatment, the patient has consented to  Procedure(s): LAPAROSCOPIC CHOLECYSTECTOMY WITH INTRAOPERATIVE CHOLANGIOGRAM SINGLE SITE (N/A) as a surgical intervention.  The patient's history has been reviewed, patient examined, no change in status, stable for surgery.  I have reviewed the patient's chart and labs.  Questions were answered to the patient's satisfaction.    The anatomy & physiology of hepatobiliary & pancreatic function was discussed.  The pathophysiology of gallbladder dysfunction was discussed.  Natural history risks without surgery was discussed.   I feel the risks of no intervention will lead to serious problems that outweigh the operative risks; therefore, I recommended cholecystectomy to remove the pathology.  I explained laparoscopic techniques with possible need for an open approach.  Probable cholangiogram to evaluate the bilary tract was explained as well.    Risks such as bleeding, infection, diarrhea and other bowel changes, abscess, leak, injury to other organs, need for repair of tissues / organs, need for further treatment, stroke, heart attack, death, and other risks were discussed.  I noted a good likelihood this will help address the problem, but there is a chance it may not help.  Possibility that this will not correct all abdominal symptoms was explained.  Goals of post-operative recovery were discussed as well.  We will work to minimize complications.  An educational handout further explaining the pathology and treatment options was given as well.  Questions were answered.  The patient expresses understanding & wishes to proceed with  surgery.  I have re-reviewed the the patient's records, history, medications, and allergies.  I have re-examined the patient.  I again discussed intraoperative plans and goals of post-operative recovery.  The patient agrees to proceed.  Vickie Chang  [birth year] [id number]  Patient Care Team: Harrison Mons, PA as PCP - General (Family Medicine) Tanda Rockers, MD as Consulting Physician (Pulmonary Disease) Marti Sleigh, MD as Consulting Physician (Gynecologic Oncology)  Patient Active Problem List   Diagnosis Date Noted   Abdominal pain 02/03/2020   Family history of coronary artery disease in father 05/26/2015   TIA (transient ischemic attack) 04/28/2015   Hypokalemia 04/28/2015   HLD (hyperlipidemia)    Change in vision 04/27/2015   Central retinal artery occlusion of right eye 04/27/2015   Intrinsic asthma    Hypothyroidism    GERD (gastroesophageal reflux disease)    Depression    Anxiety    Essential hypertension, benign 01/04/2008   COUGH 10/19/2007    Past Medical History:  Diagnosis Date   Anxiety    Central retinal artery occlusion, right eye    a. Dx 2016 - admitted with transient visual disturbance, had recently been dx with central retinal artery occlusion.   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 Fermin Schwab at [facility] History    Marital status: Widowed    Spouse name: Not on file   Number of children: Not on file   Years of education: Not on file   Highest education level: Not on file  Occupational History   Not on file  Tobacco Use   Smoking status: Never Smoker   Smokeless tobacco: Never Used  Substance and Sexual Activity   Alcohol use: No   Drug use: No   Sexual activity: Not on file  Other Topics Concern   Not on file  Social History Narrative   Not on file   Social Determinants of Health   Financial Resource Strain:    Difficulty of Paying Living Expenses:   Food Insecurity:    Worried About [place] in the Last Year:    Arboriculturist in the Last Year:   Transportation Needs:    Film/video editor (Medical):    Lack of Transportation (Non-Medical):   Physical Activity:    Days of Exercise per Week:    Minutes of Exercise per Session:   Stress:    Feeling of Stress :   Social Connections:    Frequency of Communication with Friends and Family:    Frequency of Social Gatherings with Friends and Family:    Attends Religious Services:    Active Member of Clubs or Organizations:    Attends Music therapist:    Marital Status:   Intimate Partner Violence:    Fear of Current or Ex-Partner:    Emotionally Abused:    Physically Abused:    Sexually Abused:     Family History  Problem Relation Age of Onset   Heart attack Father        died in his 4's of MI   Stroke Father    Asthma Sister     Medications Prior to Admission  Medication Sig Dispense Refill Last Dose   acetaminophen (TYLENOL) 325 MG tablet Take 650 mg by mouth every 6 (six) hours as needed.   Past Week at Unknown time   albuterol (VENTOLIN HFA) 108 (90 Base) MCG/ACT inhaler Inhale 1 puff into the lungs every 6 (six) hours as needed for wheezing or shortness of breath.   Past Month at Unknown time   ALPRAZolam (XANAX) 0.25 MG tablet Take 0.25 mg by mouth daily as needed for anxiety.     02/02/2020 at Unknown time   aspirin EC 81 MG tablet Take 81 mg by mouth daily.   02/02/2020 at Unknown time   atorvastatin (LIPITOR) 20 MG tablet Take 1 tablet (20 mg total) by mouth daily at 6 PM. (Patient taking differently: Take 20 mg by mouth daily. ) 30 tablet 1 02/02/2020 at Unknown time   Azelastine-Fluticasone 137-50 MCG/ACT SUSP Place 1 spray into both nostrils 2 (two) times daily.   02/02/2020 at Unknown time   buPROPion (WELLBUTRIN XL) 150 MG 24 hr tablet Take 450 mg by mouth daily.   02/02/2020 at Unknown time   buPROPion (ZYBAN) 150 MG 12 hr tablet Take 450 mg by mouth daily.   02/02/2020 at Unknown time   clotrimazole-betamethasone (LOTRISONE) cream Apply 1 application topically 2 (two) times daily as needed (for itching).   3 Past Month at Unknown time   dexlansoprazole (  DEXILANT) 60 MG capsule Take 60 mg by mouth daily.   02/02/2020 at Unknown time   Eszopiclone (ESZOPICLONE) 3 MG TABS Take 3 mg by mouth at bedtime. Take immediately before bedtime   02/02/2020 at Unknown time   famotidine (PEPCID) 20 MG tablet Take 40 mg by mouth 2 (two) times daily.    02/02/2020 at Unknown time   FLOVENT HFA 220 MCG/ACT inhaler Inhale 1 puff into the lungs daily.   3 Past Week at Unknown time   ketotifen (ZADITOR) 0.025 % ophthalmic solution Place 1 drop into both eyes daily.   02/02/2020 at Unknown time   latanoprost (XALATAN) 0.005 % ophthalmic solution Place 1 drop into both eyes at bedtime.   12 02/02/2020 at Unknown time   levothyroxine (SYNTHROID) 88 MCG tablet Take 88 mcg by mouth daily.   02/02/2020 at Unknown time   losartan (COZAAR) 100 MG tablet Take 1 tablet (100 mg total) by mouth daily.   02/02/2020 at Unknown time   Multiple Vitamins-Minerals (CENTRUM SILVER ULTRA WOMENS PO) Take 1 tablet by mouth daily.   02/02/2020 at Unknown time   nebivolol (BYSTOLIC) 5 MG tablet Take 1 tablet (5 mg total) by mouth daily. 30 tablet  02/02/2020 at Unknown time   ondansetron (ZOFRAN-ODT) 4 MG disintegrating  tablet Take 4 mg by mouth in the morning, at noon, in the evening, and at bedtime.   2 02/01/2020   potassium chloride SA (K-DUR,KLOR-CON) 20 MEQ tablet Take 60 mEq by mouth daily.   02/02/2020 at Unknown time   traMADol (ULTRAM) 50 MG tablet Take 50 mg by mouth daily as needed for moderate pain. 1 -2 every 4 hrs. As needed    Past Month at Unknown time   XIIDRA 5 % SOLN Place 1 drop into both eyes at bedtime.    02/02/2020 at Unknown time    Current Facility-Administered Medications  Medication Dose Route Frequency Provider Last Rate Last Admin   0.9 %  sodium chloride infusion   Intravenous Continuous Kc, Ramesh, MD 125 mL/hr at 02/04/20 0600 New Bag at 02/04/20 0600   [MAR Hold] acetaminophen (TYLENOL) tablet 650 mg  650 mg Oral Q6H PRN Antonieta Pert, MD   650 mg at 02/03/20 2335   Or   [MAR Hold] acetaminophen (TYLENOL) suppository 650 mg  650 mg Rectal Q6H PRN Antonieta Pert, MD   650 mg at 02/04/20 0520   acetaminophen (TYLENOL) tablet 1,000 mg  1,000 mg Oral On Call to OR Michael Boston, MD       [facility] Hold] alum & mag hydroxide-simeth (MAALOX/MYLANTA) 200-200-20 MG/5ML suspension 30 mL  30 mL Oral Once Kc, Maren [place], MD       [MAR Hold] alum & mag hydroxide-simeth (MAALOX/MYLANTA) 200-200-20 MG/5ML suspension 30 mL  30 mL Oral Q4H PRN Kc, Ramesh, MD   30 mL at 02/03/20 1739   bupivacaine liposome (EXPAREL) 1.3 % injection 266 mg  20 mL Infiltration Once Michael Boston, MD       [facility] Hold] ciprofloxacin (CIPRO) IVPB 400 mg  400 mg Intravenous Q12H Kc, Ramesh, MD 200 mL/hr at 02/04/20 0604 400 mg at 02/04/20 0604   And   [MAR Hold] metroNIDAZOLE (FLAGYL) IVPB 500 mg  500 mg Intravenous Q8H Kc, Ramesh, MD 100 mL/hr at 02/04/20 0603 500 mg at 02/04/20 0603   clindamycin (CLEOCIN) IVPB 600 mg  600 mg Intravenous On Call to OR Michael Boston, MD       [facility] Hold] fentaNYL (SUBLIMAZE) injection 100 mcg  100 mcg Intravenous Once Kc, Maren Beach, MD       gabapentin (NEURONTIN) capsule 300 mg  300 mg Oral On Call to OR  Michael Boston, MD       gentamicin (GARAMYCIN) IVPB 100 mg  100 mg Intravenous On Call to OR Michael Boston, MD       heparin injection 5,000 Units  5,000 Units Subcutaneous Q8H Earnstine Regal, PA-C   5,000 Units at 02/03/20 2144   [facility] Hold] HYDROcodone-acetaminophen (NORCO/VICODIN) 5-325 MG per tablet 1 tablet  1 tablet Oral Q6H PRN Antonieta Pert, MD       lactated ringers infusion   Intravenous Continuous Michael Boston, MD 50 mL/hr at 02/04/20 1042 New Bag at 02/04/20 1042   [MAR Hold] metoprolol tartrate (LOPRESSOR) injection 5 mg  5 mg Intravenous Q6H PRN Kc, Maren Beach, MD       [MAR Hold] ondansetron (ZOFRAN) tablet 4 mg  4 mg Oral Q6H PRN Antonieta Pert, MD       Or   [facility] Hold] ondansetron (ZOFRAN) injection 4 mg  4 mg Intravenous Q6H PRN Kc, Maren Beach, MD   4 mg at 02/04/20 0601   [MAR Hold] pantoprazole (PROTONIX) injection 40 mg  40 mg Intravenous Q24H Kc, Maren Beach, MD   40 mg at 02/03/20 2144   [MAR Hold] sodium chloride (OCEAN) 0.65 % nasal spray 1 spray  1 spray Each Nare PRN Adefeso, Oladapo, DO   1 spray at 02/03/20 2144     Allergies  Allergen Reactions   Sulfa Antibiotics Hives   Tetanus Toxoids Swelling   Codeine Other (See Comments)    gi upset   Penicillins Swelling   Sulfonamide Derivatives Hives    BP (!) 164/75 (BP Location: Right Arm)   Pulse 83   Temp 98.2 F (36.8 C) (Oral)   Resp 18   Ht 5' (1.524 m)   Wt 65 kg   SpO2 97%   BMI 27.99 kg/m   Labs: Results for orders placed or performed during the hospital encounter of 02/03/20 (from the past 48 hour(s))  CBC with Differential/Platelet     Status: Abnormal   Collection Time: 02/03/20  5:12 AM  Result Value Ref Range   WBC 17.9 (H) 4.0 - 10.5 K/uL   RBC 4.55 3.87 - 5.11 MIL/uL   Hemoglobin 13.2 12.0 - 15.0 g/dL   HCT 38.4 36.0 - 46.0 %   MCV 84.4 80.0 - 100.0 fL   MCH 29.0 26.0 - 34.0 pg   MCHC 34.4 30.0 - 36.0 g/dL   RDW 13.4 11.5 - 15.5 %   Platelets 246 150 - 400 K/uL   nRBC 0.0 0.0 - 0.2 %    Neutrophils Relative % 94 %   Neutro Abs 16.9 (H) 1.7 - 7.7 K/uL   Lymphocytes Relative 4 %   Lymphs Abs 0.6 (L) 0.7 - 4.0 K/uL   Monocytes Relative 1 %   Monocytes Absolute 0.1 0.1 - 1.0 K/uL   Eosinophils Relative 1 %   Eosinophils Absolute 0.1 0.0 - 0.5 K/uL   Basophils Relative 0 %   Basophils Absolute 0.0 0.0 - 0.1 K/uL   Immature Granulocytes 0 %   Abs Immature Granulocytes 0.06 0.00 - 0.07 K/uL    Comment: Performed at [facility], [address] [place], [place] [address]  Troponin I (High Sensitivity)     Status: None   Collection Time: 02/03/20  5:13 AM  Result Value Ref Range   Troponin I (High  Sensitivity) 5 <18 ng/L    Comment: (NOTE) Elevated high sensitivity troponin I (hsTnI) values and significant  changes across serial measurements may suggest ACS but many other  chronic and acute conditions are known to elevate hsTnI results.  Refer to the "Links" section for chest pain algorithms and additional  guidance. Performed at [facility], [address] [place], [place] [address]   Comprehensive metabolic panel     Status: Abnormal   Collection Time: 02/03/20  5:13 AM  Result Value Ref Range   Sodium 141 135 - 145 mmol/L   Potassium 3.1 (L) 3.5 - 5.1 mmol/L   Chloride 103 98 - 111 mmol/L   CO2 27 22 - 32 mmol/L   Glucose, Bld 153 (H) 70 - 99 mg/dL    Comment: Glucose reference range applies only to samples taken after fasting for at least 8 hours.   BUN 11 8 - 23 mg/dL   Creatinine, Ser 0.98 0.44 - 1.00 mg/dL   Calcium 8.9 8.9 - 10.3 mg/dL   Total Protein 6.9 6.5 - 8.1 g/dL   Albumin 4.0 3.5 - 5.0 g/dL   AST 181 (H) 15 - 41 U/L   ALT 94 (H) 0 - 44 U/L   Alkaline Phosphatase 110 38 - 126 U/L   Total Bilirubin 1.2 0.3 - 1.2 mg/dL   GFR calc non Af Amer 58 (L) >60 mL/min   GFR calc Af Amer >60 >60 mL/min   Anion gap 11 5 - 15    Comment: Performed at [facility], [address] [place], [place] [address]  Lipase, blood      Status: Abnormal   Collection Time: 02/03/20  5:13 AM  Result Value Ref Range   Lipase 81 (H) 11 - 51 U/L    Comment: Performed at [facility], [address] [place], [place] [address]  SARS Coronavirus 2 by RT PCR (hospital order, performed in [facility] hospital lab) Nasopharyngeal Nasopharyngeal Swab     Status: None   Collection Time: 02/03/20  5:13 AM   Specimen: Nasopharyngeal Swab  Result Value Ref Range   SARS Coronavirus 2 NEGATIVE NEGATIVE    Comment: (NOTE) SARS-CoV-2 target nucleic acids are NOT DETECTED. The SARS-CoV-2 RNA is generally detectable in upper and lower respiratory specimens during the acute phase of infection. The lowest concentration of SARS-CoV-2 viral copies this assay can detect is 250 copies / mL. A negative result does not preclude SARS-CoV-2 infection and should not be used as the sole basis for treatment or other patient management decisions.  A negative result may occur with improper specimen collection / handling, submission of specimen other than nasopharyngeal swab, presence of viral mutation(s) within the areas targeted by this assay, and inadequate number of viral copies (<250 copies / mL). A negative result must be combined with clinical observations, patient history, and epidemiological information. Fact Sheet for Patients:   StrictlyIdeas.no Fact Sheet for Healthcare Providers: BankingDealers.co.za This test is not yet approved or cleared  by the [place] FDA and has been authorized for detection and/or diagnosis of SARS-CoV-2 by FDA under an Emergency Use Authorization (EUA).  This EUA will remain in effect (meaning this test can be used) for the duration of the COVID-19 declaration under Section 564(b)(1) of the Act, 21 U.S.C. section 360bbb-3(b)(1), unless the authorization is terminated or revoked sooner. Performed at [facility], [address],  [place], [place] [address]  Troponin I (High Sensitivity)     Status: None   Collection Time: 02/03/20  8:40 AM  Result Value Ref Range   Troponin I (High Sensitivity) 7 <18 ng/L    Comment: (NOTE) Elevated high sensitivity troponin I (hsTnI) values and significant  changes across serial measurements may suggest ACS but many other  chronic and acute conditions are known to elevate hsTnI results.  Refer to the "Links" section for chest pain algorithms and additional  guidance. Performed at [facility], [address] [place], [place] [address]   CBG monitoring, ED     Status: Abnormal   Collection Time: 02/03/20  1:52 PM  Result Value Ref Range   Glucose-Capillary 190 (H) 70 - 99 mg/dL    Comment: Glucose reference range applies only to samples taken after fasting for at least 8 hours.  Comprehensive metabolic panel     Status: Abnormal   Collection Time: 02/04/20  4:38 AM  Result Value Ref Range   Sodium 142 135 - 145 mmol/L   Potassium 3.6 3.5 - 5.1 mmol/L   Chloride 107 98 - 111 mmol/L   CO2 25 22 - 32 mmol/L   Glucose, Bld 99 70 - 99 mg/dL    Comment: Glucose reference range applies only to samples taken after fasting for at least 8 hours.   BUN 9 8 - 23 mg/dL   Creatinine, Ser 0.85 0.44 - 1.00 mg/dL   Calcium 8.4 (L) 8.9 - 10.3 mg/dL   Total Protein 6.2 (L) 6.5 - 8.1 g/dL   Albumin 3.6 3.5 - 5.0 g/dL   AST 284 (H) 15 - 41 U/L   ALT 291 (H) 0 - 44 U/L   Alkaline Phosphatase 107 38 - 126 U/L   Total Bilirubin 3.9 (H) 0.3 - 1.2 mg/dL   GFR calc non Af Amer >60 >60 mL/min   GFR calc Af Amer >60 >60 mL/min   Anion gap 10 5 - 15    Comment: Performed at [facility], [place] [address] [place], [place] [address]  CBC     Status: Abnormal   Collection Time: 02/04/20  4:38 AM  Result Value Ref Range   WBC 13.2 (H) 4.0 - 10.5 K/uL   RBC 4.04 3.87 - 5.11 MIL/uL   Hemoglobin 11.6 (L) 12.0 - 15.0 g/dL   HCT 35.3 (L) 36.0 - 46.0 %   MCV 87.4 80.0 -  100.0 fL   MCH 28.7 26.0 - 34.0 pg   MCHC 32.9 30.0 - 36.0 g/dL   RDW 13.6 11.5 - 15.5 %   Platelets 188 150 - 400 K/uL   nRBC 0.0 0.0 - 0.2 %    Comment: Performed at [facility], [place] [address], [place], [place] [address]  Lipase, blood     Status: None   Collection Time: 02/04/20  4:38 AM  Result Value Ref Range   Lipase 20 11 - 51 U/L    Comment: Performed at [facility] [place] [address] [place], [place] [address]  Surgical PCR screen     Status: Abnormal   Collection Time: 02/04/20  4:59 AM   Specimen: Nasal Mucosa; Nasal Swab  Result Value Ref Range   MRSA, PCR POSITIVE (A) NEGATIVE    Comment: RESULT CALLED TO, READ BACK BY AND VERIFIED WITH: WAY,H. RN @0711  ON 5.14.2021 BY [email or web address]    Staphylococcus aureus POSITIVE (A) NEGATIVE    Comment: (NOTE) The Xpert SA Assay (FDA  approved for NASAL specimens in patients 37 years of age and older), is one component of a comprehensive surveillance program. It is not intended to diagnose infection nor to guide or monitor treatment. Performed at [facility], [place] [address] [place], [place] [address]     Imaging / Studies: CT Head Wo Contrast  Result Date: 02/03/2020 CLINICAL DATA:  Vomiting and dizziness.  Frontal headache EXAM: CT HEAD WITHOUT CONTRAST TECHNIQUE: Contiguous axial images were obtained from the base of the skull through the vertex without intravenous contrast. COMPARISON:  06/03/2015 FINDINGS: Brain: No evidence of acute infarction, hemorrhage, hydrocephalus, extra-axial collection or mass lesion/mass effect. 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 Vascular: Atherosclerotic calcification. Skull: Normal. Negative for fracture or focal lesion. Sinuses/Orbits: Bilateral cataract resection.  No acute finding. IMPRESSION: 1. No acute finding. 2. Extensive chronic small vessel ischemia in the cerebral white matter. Electronically Signed    By: Monte Fantasia M.D.   On: 02/03/2020 06:08   DG Chest Portable 1 View  Result Date: 02/03/2020 CLINICAL DATA:  Shortness of breath EXAM: PORTABLE CHEST 1 VIEW COMPARISON:  06/03/2015 FINDINGS: Better inflated than before. There is no edema, consolidation, effusion, or pneumothorax. Normal heart size and mediastinal contours. Artifact from EKG leads. IMPRESSION: Negative. Electronically Signed   By: Monte Fantasia M.D.   On: 02/03/2020 05:50   CT Angio Chest/Abd/Pel for Dissection W and/or Wo Contrast  Result Date: 02/03/2020 CLINICAL DATA:  Chest pain with nausea and vomiting. EXAM: CT ANGIOGRAPHY CHEST, ABDOMEN AND PELVIS TECHNIQUE: Non-contrast CT of the chest was initially obtained. Multidetector CT imaging through the chest, abdomen and pelvis was performed using the standard protocol during bolus administration of intravenous contrast. Multiplanar reconstructed images and MIPs were obtained and reviewed to evaluate the vascular anatomy. CONTRAST:  143m OMNIPAQUE IOHEXOL 350 MG/ML SOLN COMPARISON:  Chest x-ray from same day. CT chest, abdomen, and pelvis dated June 03, 2015. FINDINGS: CTA CHEST FINDINGS Cardiovascular: Preferential opacification of the thoracic aorta. No evidence of thoracic aortic aneurysm or dissection. Mild atherosclerotic calcification of the aortic arch and great vessels. Normal heart size. Chronic trace pericardial effusion. No central pulmonary embolism. Mediastinum/Nodes: No enlarged mediastinal, hilar, or axillary lymph nodes. Unchanged 8 mm hypodense nodule small calcifications in the right thyroid lobe, stable since 2016. Not clinically significant; no follow-up imaging recommended. Trachea and esophagus demonstrate no significant findings. Lungs/Pleura: Lungs are clear. No pleural effusion or pneumothorax. Musculoskeletal: No chest wall abnormality. No acute or significant osseous findings. Chronic mild T10 and T12 superior endplate compression deformities.  Review of the MIP images confirms the above findings. CTA ABDOMEN AND PELVIS FINDINGS VASCULAR Aorta: Normal caliber aorta without aneurysm, dissection, vasculitis or significant stenosis. Mild atherosclerotic calcification. Celiac: Patent without evidence of aneurysm, dissection, vasculitis or significant stenosis. SMA: Patent without evidence of aneurysm, dissection, vasculitis or significant stenosis. Renals: Both renal arteries are patent without evidence of aneurysm, dissection, vasculitis, fibromuscular dysplasia or significant stenosis. IMA: Patent without evidence of aneurysm, dissection, vasculitis or significant stenosis. Inflow: Patent without evidence of aneurysm, dissection, vasculitis or significant stenosis. Veins: No obvious venous abnormality within the limitations of this arterial phase study. Review of the MIP images confirms the above findings. NON-VASCULAR Hepatobiliary: No focal liver abnormality is seen. Small gallstones. No gallbladder wall thickening. The common bile duct measures 7 mm, at the upper limits of normal. Pancreas: Unremarkable. No pancreatic ductal dilatation or surrounding inflammatory changes. Spleen: Normal in size without focal abnormality. Adrenals/Urinary  Tract: Adrenal glands are unremarkable. Kidneys are normal, without renal calculi, focal lesion, or hydronephrosis. Bladder is unremarkable. Stomach/Bowel: Small hiatal hernia. The stomach is otherwise within normal limits. No bowel wall thickening, distention, or surrounding inflammatory changes. Mild sigmoid diverticulosis. Normal appendix. Lymphatic: No enlarged abdominal or pelvic lymph nodes. Reproductive: Status post hysterectomy. No adnexal masses. Other: No abdominal wall hernia or abnormality. No abdominopelvic ascites. No pneumoperitoneum. Musculoskeletal: No acute or significant osseous findings. Review of the MIP images confirms the above findings. IMPRESSION: 1. No evidence of acute aortic syndrome or  aneurysm. 2. No acute intrathoracic or intra-abdominal findings. 3. Cholelithiasis. 4. Aortic Atherosclerosis (ICD10-I70.0). Electronically Signed   By: Titus Dubin M.D.   On: 02/03/2020 06:44   US Abdomen Limited RUQ  Result Date: 02/03/2020 CLINICAL DATA:  Right upper quadrant abdominal pain. Cholelithiasis on CT from earlier today. EXAM: ULTRASOUND ABDOMEN LIMITED RIGHT UPPER QUADRANT COMPARISON:  02/03/2020 chest, abdomen and pelvis CT angiogram. FINDINGS: Gallbladder: There is a layering 1.5 cm gallstone in mildly distended gallbladder. No gallbladder wall thickening. No sonographic Murphy's sign. No pericholecystic fluid. Common bile duct: Diameter: 6 mm Liver: Liver parenchyma is diffusely mildly echogenic. No definite liver surface irregularity. No liver masses, noting decreased sensitivity in the setting of an echogenic liver. Portal vein is patent on color Doppler imaging with normal direction of blood flow towards the liver. Other: None. IMPRESSION: 1. Cholelithiasis.  No evidence of acute cholecystitis. 2. Top-normal common bile duct diameter (6 mm). 3. Diffusely mildly echogenic liver parenchyma, nonspecific, which can be due to hepatic steatosis or fibrosis. Consider outpatient hepatic elastography for further liver fibrosis risk stratification, as clinically warranted. Electronically Signed   By: Ilona Sorrel M.D.   On: 02/03/2020 10:32     .Adin Hector, M.D., F.A.C.S. Gastrointestinal and Minimally Invasive Surgery [facility] [address] [place], [place] [address] [phone] Main / Paging  02/04/2020 11:21 AM    Adin Hector

## 2020-02-04 NOTE — Progress Notes (Signed)
Lab notified RN of positive MRSA PCR. Hospitalist Kyle notified via page.

## 2020-02-05 ENCOUNTER — Inpatient Hospital Stay (HOSPITAL_COMMUNITY): Payer: Medicare Other

## 2020-02-05 ENCOUNTER — Inpatient Hospital Stay (HOSPITAL_COMMUNITY): Payer: Medicare Other | Admitting: Certified Registered Nurse Anesthetist

## 2020-02-05 ENCOUNTER — Encounter (HOSPITAL_COMMUNITY)
Admission: EM | Disposition: A | Discharge status: Home / Self Care | Payer: Self-pay | Source: Home / Self Care | Attending: Internal Medicine

## 2020-02-05 DIAGNOSIS — R7989 Other specified abnormal findings of blood chemistry: Secondary | ICD-10-CM

## 2020-02-05 DIAGNOSIS — R932 Abnormal findings on diagnostic imaging of liver and biliary tract: Secondary | ICD-10-CM

## 2020-02-05 DIAGNOSIS — K8050 Calculus of bile duct without cholangitis or cholecystitis without obstruction: Secondary | ICD-10-CM

## 2020-02-05 HISTORY — PX: ERCP: SHX5425

## 2020-02-05 HISTORY — PX: SPHINCTEROTOMY: SHX5279

## 2020-02-05 HISTORY — PX: REMOVAL OF STONES: SHX5545

## 2020-02-05 LAB — MAGNESIUM: Magnesium: 1.8 mg/dL (ref 1.7–2.4)

## 2020-02-05 LAB — CBC
HCT: 36.3 % (ref 36.0–46.0)
Hemoglobin: 11.9 g/dL — ABNORMAL LOW (ref 12.0–15.0)
MCH: 28.6 pg (ref 26.0–34.0)
MCHC: 32.8 g/dL (ref 30.0–36.0)
MCV: 87.3 fL (ref 80.0–100.0)
Platelets: 188 10*3/uL (ref 150–400)
RBC: 4.16 MIL/uL (ref 3.87–5.11)
RDW: 13.9 % (ref 11.5–15.5)
WBC: 17.6 10*3/uL — ABNORMAL HIGH (ref 4.0–10.5)
nRBC: 0 % (ref 0.0–0.2)

## 2020-02-05 LAB — HEPATIC FUNCTION PANEL
ALT: 324 U/L — ABNORMAL HIGH (ref 0–44)
AST: 312 U/L — ABNORMAL HIGH (ref 15–41)
Albumin: 3.3 g/dL — ABNORMAL LOW (ref 3.5–5.0)
Alkaline Phosphatase: 105 U/L (ref 38–126)
Bilirubin, Direct: 1.2 mg/dL — ABNORMAL HIGH (ref 0.0–0.2)
Indirect Bilirubin: 1.2 mg/dL — ABNORMAL HIGH (ref 0.3–0.9)
Total Bilirubin: 2.4 mg/dL — ABNORMAL HIGH (ref 0.3–1.2)
Total Protein: 6 g/dL — ABNORMAL LOW (ref 6.5–8.1)

## 2020-02-05 LAB — LIPASE, BLOOD: Lipase: 20 U/L (ref 11–51)

## 2020-02-05 LAB — CREATININE, SERUM
Creatinine, Ser: 1.29 mg/dL — ABNORMAL HIGH (ref 0.44–1.00)
GFR calc Af Amer: 48 mL/min — ABNORMAL LOW (ref >60)
GFR calc non Af Amer: 41 mL/min — ABNORMAL LOW (ref >60)

## 2020-02-05 LAB — POTASSIUM: Potassium: 3.5 mmol/L (ref 3.5–5.1)

## 2020-02-05 SURGERY — ERCP, WITH INTERVENTION IF INDICATED
Anesthesia: General

## 2020-02-05 MED ORDER — PROPOFOL 10 MG/ML IV BOLUS
INTRAVENOUS | Status: DC | PRN
  Administered 2020-02-05 (×2): 10 mg via INTRAVENOUS
  Administered 2020-02-05: 130 mg via INTRAVENOUS
  Administered 2020-02-05 (×2): 10 mg via INTRAVENOUS

## 2020-02-05 MED ORDER — SUGAMMADEX SODIUM 200 MG/2ML IV SOLN
INTRAVENOUS | Status: DC | PRN
  Administered 2020-02-05: 200 mg via INTRAVENOUS

## 2020-02-05 MED ORDER — INDOMETHACIN 50 MG RE SUPP
100.0000 mg | Freq: Once | RECTAL | Status: DC
  Filled 2020-02-05: qty 2

## 2020-02-05 MED ORDER — FENTANYL CITRATE (PF) 100 MCG/2ML IJ SOLN
INTRAMUSCULAR | Status: DC | PRN
  Administered 2020-02-05: 100 ug via INTRAVENOUS

## 2020-02-05 MED ORDER — FENTANYL CITRATE (PF) 100 MCG/2ML IJ SOLN
INTRAMUSCULAR | Status: AC
  Filled 2020-02-05: qty 2

## 2020-02-05 MED ORDER — LIDOCAINE 2% (20 MG/ML) 5 ML SYRINGE
INTRAMUSCULAR | Status: DC | PRN
  Administered 2020-02-05: 40 mg via INTRAVENOUS

## 2020-02-05 MED ORDER — ONDANSETRON HCL 4 MG/2ML IJ SOLN
INTRAMUSCULAR | Status: DC | PRN
  Administered 2020-02-05: 4 mg via INTRAVENOUS

## 2020-02-05 MED ORDER — PHENYLEPHRINE 40 MCG/ML (10ML) SYRINGE FOR IV PUSH (FOR BLOOD PRESSURE SUPPORT)
PREFILLED_SYRINGE | INTRAVENOUS | Status: DC | PRN
  Administered 2020-02-05 (×3): 80 ug via INTRAVENOUS

## 2020-02-05 MED ORDER — SODIUM CHLORIDE 0.9 % IV SOLN
INTRAVENOUS | Status: DC | PRN
  Administered 2020-02-05: 25 mL

## 2020-02-05 MED ORDER — LACTATED RINGERS IV SOLN: INTRAVENOUS | Status: DC | PRN

## 2020-02-05 MED ORDER — CIPROFLOXACIN IN D5W 400 MG/200ML IV SOLN: 400.0000 mg | Freq: Once | INTRAVENOUS | Status: DC

## 2020-02-05 MED ORDER — INDOMETHACIN 50 MG RE SUPP
RECTAL | Status: DC | PRN
  Administered 2020-02-05: 100 mg via RECTAL

## 2020-02-05 MED ORDER — DEXAMETHASONE SODIUM PHOSPHATE 10 MG/ML IJ SOLN
INTRAMUSCULAR | Status: DC | PRN
  Administered 2020-02-05: 10 mg via INTRAVENOUS

## 2020-02-05 MED ORDER — PROPOFOL 10 MG/ML IV BOLUS
INTRAVENOUS | Status: AC
  Filled 2020-02-05: qty 20

## 2020-02-05 MED ORDER — ROCURONIUM BROMIDE 10 MG/ML (PF) SYRINGE
PREFILLED_SYRINGE | INTRAVENOUS | Status: DC | PRN
  Administered 2020-02-05: 50 mg via INTRAVENOUS

## 2020-02-05 MED ORDER — GLUCAGON HCL RDNA (DIAGNOSTIC) 1 MG IJ SOLR
INTRAMUSCULAR | Status: AC
  Filled 2020-02-05: qty 1

## 2020-02-05 MED ORDER — GLUCAGON HCL RDNA (DIAGNOSTIC) 1 MG IJ SOLR
INTRAMUSCULAR | Status: DC | PRN
Start: 2020-02-05 — End: 2020-02-05
  Administered 2020-02-05 (×4): .25 mg via INTRAVENOUS

## 2020-02-05 MED ORDER — SODIUM CHLORIDE 0.9 % IV SOLN: INTRAVENOUS | Status: DC

## 2020-02-05 NOTE — Transfer of Care (Signed)
Immediate Anesthesia Transfer of Care Note  Patient: Vickie Chang  Procedure(s) Performed: ENDOSCOPIC RETROGRADE CHOLANGIOPANCREATOGRAPHY (ERCP) (N/A ) SPHINCTEROTOMY REMOVAL OF STONES  Patient Location: Endoscopy Unit  Anesthesia Type:General  Level of Consciousness: drowsy and patient cooperative  Airway & Oxygen Therapy: Patient Spontanous Breathing and Patient connected to face mask oxygen  Post-op Assessment: Report given to RN and Post -op Vital signs reviewed and stable  Post vital signs: Reviewed and stable  Last Vitals:  Vitals Value Taken Time  BP    Temp    Pulse 80 02/05/20 1135  Resp 16 02/05/20 1135  SpO2 97 % 02/05/20 1135  Vitals shown include unvalidated device data.  Last Pain:  Vitals:   02/05/20 0957  TempSrc: Oral  PainSc: 3       Patients Stated Pain Goal: 2 ([id number])  Complications: No apparent anesthesia complications

## 2020-02-05 NOTE — Progress Notes (Signed)
PROGRESS NOTE  NIKO AUMENT [id number] DOB: 08/06/1946 DOA: 02/03/2020 PCP: Harrison Mons, [place] Hospital Course/Subjective: Vickie Chang is a 74 y.o. 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 ED for evaluation of epigastric pain.  Pain is described as sharp stabbing with radiation to the back, started suddenly, constant in nature and relieved with vomiting.She had associated nausea vomiting flatus, heart burn. She c/o fatigue /tiredness and has been having nausea and vomiting once a week. She was found on 5/13 at [facility] to have gallstone pancreatitis and cholelithiasis without evidence of cholecystitis. Under went lap chole with positive IOC on 5/14.   Seen and examined this PM after ERCP, she is doing well about to have some clear liquids for lunch.   Assessment/Plan: Gallstone pancreatitis/Symptomatic cholelithiasis:Epigastric abdominal pain: With abnormal AST/ALT up today but lipase down to 20.  s/p lap chole 5/14 and ercp 5/15. Clear liquid diet, advance per GI/surgery.  Hypokalemia was replaced. Resolved.  Leucocytosis likely from #1. Mild temp. 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  Anxiety/depression-on Xanax and Wellbutrin, resume slowly.  DM diet controlled: Monitor sugar.  Last hba1c 6.2 in 2016.  GERD- add ppi iv for now.  HTN: Blood pressure currently stable.Hold home meds.  HLD: Hold statin in the setting of #1.  Hypothyroidism: Resume Synthroid.  DVT Prophylaxis: Lovenox  Code Status: FULL    Objective: Vitals:   02/05/20 1140 02/05/20 1150 02/05/20 1158 02/05/20 1341  BP: (!) 140/58 (!) 153/65 (!) 157/52 (!) 155/89  Pulse: 79 74 73 76  Resp: 18 16 19 20   Temp:     98 F (36.7 C)  TempSrc:    Oral  SpO2: 100% 99% 95% 97%  Weight:      Height:        Intake/Output Summary (Last 24 hours) at 02/05/2020 1346 Last data filed at 02/05/2020 1132 Gross per 24 hour  Intake 3373.76 ml  Output 0 ml  Net 3373.76 ml   Filed Weights   02/03/20 0516  Weight: 65 kg     Exam: General:  Alert, oriented, calm, in no acute distress Eyes: EOMI, clear sclerea Neck: supple, no masses, trachea mildline  Cardiovascular: RRR, no murmurs or rubs, no peripheral edema  Respiratory: clear to auscultation bilaterally, no wheezes, no crackles  Abdomen: soft, RUQ tender, nondistended, normal bowel tones heard  Skin: dry, no rashes  Musculoskeletal: no joint effusions, normal range of motion  Psychiatric: appropriate affect, normal speech  Neurologic: extraocular muscles intact, clear speech, moving all extremities with intact sensorium    Data Reviewed: CBC: Recent Labs  Lab 02/03/20 0512 02/04/20 0438 02/05/20 0416  WBC 17.9* 13.2* 17.6*  NEUTROABS 16.9*  --   --   HGB 13.2 11.6* 11.9*  HCT 38.4 35.3* 36.3  MCV 84.4 87.4 87.3  PLT 246 188 0000000   Basic Metabolic Panel: Recent Labs  Lab 02/03/20 0513 02/04/20 0438 02/05/20 0416  NA 141 142  --   K 3.1* 3.6 3.5  CL 103 107  --   CO2 27 25  --   GLUCOSE 153* 99  --   BUN 11 9  --   CREATININE 0.98 0.85 1.29*  CALCIUM 8.9 8.4*  --   MG  --   --  1.8   GFR: Estimated Creatinine Clearance: 33.2 mL/min (A) (by C-G formula based on SCr of 1.29 mg/dL (H)). Liver Function Tests: Recent Labs  Lab 02/03/20 0513 02/04/20 0438 02/05/20 0416  AST 181* 284* 312*  ALT 94* 291* 324*  ALKPHOS 110 107 105  BILITOT 1.2 3.9* 2.4*  PROT 6.9 6.2* 6.0*  ALBUMIN 4.0 3.6 3.3*   Recent Labs  Lab 02/03/20 0513 02/04/20 0438 02/05/20 0416  LIPASE 81* 20 20   No results for input(s): AMMONIA in the last 168 hours. Coagulation Profile: Recent Labs  Lab 02/04/20 2017  INR 1.2   Cardiac Enzymes: No  results for input(s): CKTOTAL, CKMB, CKMBINDEX, TROPONINI in the last 168 hours. BNP (last 3 results) No results for input(s): PROBNP in the last 8760 hours. HbA1C: No results for input(s): HGBA1C in the last 72 hours. CBG: Recent Labs  Lab 02/03/20 1352  GLUCAP 190*   Lipid Profile: No results for input(s): CHOL, HDL, LDLCALC, TRIG, CHOLHDL, LDLDIRECT in the last 72 hours. Thyroid Function Tests: No results for input(s): TSH, T4TOTAL, FREET4, T3FREE, THYROIDAB in the last 72 hours. Anemia Panel: No results for input(s): VITAMINB12, FOLATE, FERRITIN, TIBC, IRON, RETICCTPCT in the last 72 hours. Urine analysis: No results found for: COLORURINE, APPEARANCEUR, LABSPEC, PHURINE, GLUCOSEU, HGBUR, BILIRUBINUR, KETONESUR, PROTEINUR, UROBILINOGEN, NITRITE, LEUKOCYTESUR Sepsis Labs: @LABRCNTIP (procalcitonin:4,lacticidven:4)  ) Recent Results (from the past 240 hour(s))  SARS Coronavirus 2 by RT PCR (hospital order, performed in [facility] hospital lab) Nasopharyngeal Nasopharyngeal Swab     Status: None   Collection Time: 02/03/20  5:13 AM   Specimen: Nasopharyngeal Swab  Result Value Ref Range Status   SARS Coronavirus 2 NEGATIVE NEGATIVE Final    Comment: (NOTE) SARS-CoV-2 target nucleic acids are NOT DETECTED. The SARS-CoV-2 RNA is generally detectable in upper and lower respiratory specimens during the acute phase of infection. The lowest concentration of SARS-CoV-2 viral copies this assay can detect is 250 copies / mL. A negative result does not preclude SARS-CoV-2 infection and should not be used as the sole basis for treatment or other patient management decisions.  A negative result may occur with improper specimen collection / handling, submission of specimen other than nasopharyngeal swab, presence of viral mutation(s) within the areas targeted by this assay, and inadequate number of viral copies (<250 copies / mL). A negative result must be combined with clinical  observations, patient history, and epidemiological information. Fact Sheet for Patients:   StrictlyIdeas.no Fact Sheet for Healthcare Providers: BankingDealers.co.za This test is not yet approved or cleared  by the [place] FDA and has been authorized for detection and/or diagnosis of SARS-CoV-2 by FDA under an Emergency Use Authorization (EUA).  This EUA will remain in effect (meaning this test can be used) for the duration of the COVID-19 declaration under Section 564(b)(1) of the Act, 21 U.S.C. section 360bbb-3(b)(1), unless the authorization is terminated or revoked sooner. Performed at [facility], [address] [place], [place] [address]   Surgical PCR screen     Status: Abnormal   Collection Time: 02/04/20  4:59 AM   Specimen: Nasal Mucosa; Nasal Swab  Result Value Ref Range Status   MRSA, PCR POSITIVE (A) NEGATIVE Final    Comment: RESULT CALLED TO, READ BACK BY AND VERIFIED WITH: WAY,H. RN @0711  ON 5.14.2021 BY [email or web address]    Staphylococcus aureus POSITIVE (A) NEGATIVE Final    Comment: (NOTE) The Xpert SA Assay (FDA approved for NASAL specimens in patients 22 years of  age and older), is one component of a comprehensive surveillance program. It is not intended to diagnose infection nor to guide or monitor treatment. Performed at [facility], [place] [address], [place], [place] [address]      Studies: DG ERCP  Result Date: 02/05/2020 CLINICAL DATA:  ERCP for bile duct stones. EXAM: ERCP TECHNIQUE: Multiple spot images obtained with the fluoroscopic device and submitted for interpretation post-procedure. COMPARISON:  Intraoperative cholangiogram during ERCP-02/04/2020; right upper quadrant abdominal ultrasound-02/03/2020; CT the chest, abdomen and pelvis-02/03/2020 FINDINGS: Seven spot intraoperative fluoroscopic images of the right upper abdominal quadrant during ERCP are provided for review.  Initial image demonstrates an ERCP probe overlying the right upper abdominal quadrant. Surgical clips overlies expected location of the gallbladder fossa. Subsequent images demonstrate selective cannulation and opacification of the CBD. There is a potential nonocclusive filling defect in the distal aspect of the CBD potentially representative of choledocholithiasis as was suggested on preceding intraoperative cholangiogram. Subsequent images demonstrate insufflation of a balloon within the central aspect of the CBD with subsequent biliary sweeping and sphincterotomy. There is minimal opacification of the intrahepatic biliary tree as well as the residual cystic duct. No definitive contrast extravasation to suggest the presence of a biliary leak. IMPRESSION: ERCP with biliary sweeping, stone extraction and presumed sphincterotomy. These images were submitted for radiologic interpretation only. Please see the procedural report for the amount of contrast and the fluoroscopy time utilized. Electronically Signed   By: Sandi Mariscal M.D.   On: 02/05/2020 12:20    Scheduled Meds: . acetaminophen  1,000 mg Oral TID  . alum & mag hydroxide-simeth  30 mL Oral Once  . aspirin EC  81 mg Oral Daily  . atorvastatin  20 mg Oral Daily  . buPROPion  450 mg Oral Daily  . Chlorhexidine Gluconate Cloth  6 each Topical Q0600  . famotidine  40 mg Oral BID  . fentaNYL (SUBLIMAZE) injection  100 mcg Intravenous Once  . gabapentin  300 mg Oral BID  . indomethacin  100 mg Rectal Once  . latanoprost  1 drop Both Eyes QHS  . levothyroxine  88 mcg Oral Daily  . Lifitegrast  1 drop Both Eyes QHS  . lip balm  1 application Topical BID  . losartan  100 mg Oral Daily  . mupirocin ointment  1 application Nasal BID  . nebivolol  5 mg Oral Daily  . pantoprazole (PROTONIX) IV  40 mg Intravenous Q24H  . sodium chloride flush  3 mL Intravenous Q12H    Continuous Infusions: . sodium chloride 125 mL/hr at 02/04/20 0703  . sodium  chloride    . ciprofloxacin Stopped (02/05/20 0654)   And  . metronidazole 500 mg (02/05/20 1321)  . lactated ringers 1,000 mL (02/05/20 1021)  . methocarbamol (ROBAXIN) IV       LOS: 2 days   Time spent: 21 minutes  Mir Marry Guan, MD [facility] Pager [phone]  If 7PM-7AM, please contact night-coverage [email or web address] Password [email or web address] 02/05/2020, 1:46 PM

## 2020-02-05 NOTE — Anesthesia Preprocedure Evaluation (Signed)
Anesthesia Evaluation  Patient identified by MRN, date of birth, ID band Patient awake    Reviewed: Allergy & Precautions, NPO status , Patient's Chart, lab work & pertinent test results  Airway Mallampati: III  TM Distance: >3 FB Neck ROM: Full    Dental no notable dental hx.    Pulmonary asthma (mild, controlled) ,    Pulmonary exam normal breath sounds clear to auscultation       Cardiovascular hypertension, Pt. on medications and Pt. on home beta blockers Normal cardiovascular exam Rhythm:Regular Rate:Normal  ECG: SR, rate 65   Neuro/Psych PSYCHIATRIC DISORDERS Anxiety Depression Central retinal artery occlusion, right eye TIA   GI/Hepatic Neg liver ROS, GERD  Medicated and Controlled,  Endo/Other  diabetesHypothyroidism   Renal/GU negative Renal ROS     Musculoskeletal negative musculoskeletal ROS (+)   Abdominal   Peds  Hematology HLD   Anesthesia Other Findings bile duct stone  Reproductive/Obstetrics                             Anesthesia Physical Anesthesia Plan  ASA: III  Anesthesia Plan: General   Post-op Pain Management:    Induction: Intravenous  PONV Risk Score and Plan: 4 or greater and Dexamethasone, Ondansetron, Treatment may vary due to age or medical condition and Propofol infusion  Airway Management Planned: Oral ETT  Additional Equipment:   Intra-op Plan:   Post-operative Plan: Extubation in OR  Informed Consent: I have reviewed the patients History and Physical, chart, labs and discussed the procedure including the risks, benefits and alternatives for the proposed anesthesia with the patient or authorized representative who has indicated his/her understanding and acceptance.     Dental advisory given  Plan Discussed with: CRNA  Anesthesia Plan Comments:         Anesthesia Quick Evaluation

## 2020-02-05 NOTE — Interval H&P Note (Signed)
History and Physical Interval Note:  02/05/2020 10:08 AM  Vickie Chang  has presented today for surgery, with the diagnosis of bile duct stone.  The various methods of treatment have been discussed with the patient and family. After consideration of risks, benefits and other options for treatment, the patient has consented to  Procedure(s): ENDOSCOPIC RETROGRADE CHOLANGIOPANCREATOGRAPHY (ERCP) (N/A) as a surgical intervention.  The patient's history has been reviewed, patient examined, no change in status, stable for surgery.  I have reviewed the patient's chart and labs.  Questions were answered to the patient's satisfaction.     Pricilla Riffle. Fuller Plan

## 2020-02-05 NOTE — Progress Notes (Signed)
1 Day Post-Op   Subjective/Chief Complaint: Pt sore this am with intermittent pains that "go across my upper abdomen" that are "pretty bad."  She had bilious emesis with one such episode.  She is not yet passing gas.  She is no longer nauseated.  IOC yesterday showed retained stone.    Objective: Vital signs in last 24 hours: Temp:  [97.5 F (36.4 C)-98.7 F (37.1 C)] 98.7 F (37.1 C) (05/15 0545) Pulse Rate:  [70-83] 81 (05/15 0545) Resp:  [12-18] 17 (05/15 0545) BP: (151-209)/(75-101) 151/80 (05/15 0545) SpO2:  [94 %-100 %] 94 % (05/15 0545) Last BM Date: 02/02/20  Intake/Output from previous day: 05/14 0701 - 05/15 0700 In: 1300 [I.V.:1150; IV Piggyback:150] Out: -  Intake/Output this shift: Total I/O In: 1523.8 [I.V.:152.9; IV Piggyback:1370.9] Out: -   General appearance: alert, cooperative and mild distress Resp: breathing comfortably GI: soft, mildly distended.  approp tender at incisions.  dressings c/d/i. Extremities: extremities normal, atraumatic, no cyanosis or edema  Lab Results:  Recent Labs    02/04/20 0438 02/05/20 0416  WBC 13.2* 17.6*  HGB 11.6* 11.9*  HCT 35.3* 36.3  PLT 188 188   BMET Recent Labs    02/03/20 0513 02/03/20 0513 02/04/20 0438 02/05/20 0416  NA 141  --  142  --   K 3.1*   < > 3.6 3.5  CL 103  --  107  --   CO2 27  --  25  --   GLUCOSE 153*  --  99  --   BUN 11  --  9  --   CREATININE 0.98   < > 0.85 1.29*  CALCIUM 8.9  --  8.4*  --    < > = values in this interval not displayed.   PT/INR Recent Labs    02/04/20 2017  LABPROT 15.2  INR 1.2   ABG No results for input(s): PHART, HCO3 in the last 72 hours.  Invalid input(s): PCO2, PO2  Studies/Results: DG C-Arm 1-60 Min-No Report  Result Date: 02/04/2020 Fluoroscopy was utilized by the requesting physician.  No radiographic interpretation.   US Abdomen Limited RUQ  Result Date: 02/03/2020 CLINICAL DATA:  Right upper quadrant abdominal pain. Cholelithiasis on  CT from earlier today. EXAM: ULTRASOUND ABDOMEN LIMITED RIGHT UPPER QUADRANT COMPARISON:  02/03/2020 chest, abdomen and pelvis CT angiogram. FINDINGS: Gallbladder: There is a layering 1.5 cm gallstone in mildly distended gallbladder. No gallbladder wall thickening. No sonographic Murphy's sign. No pericholecystic fluid. Common bile duct: Diameter: 6 mm Liver: Liver parenchyma is diffusely mildly echogenic. No definite liver surface irregularity. No liver masses, noting decreased sensitivity in the setting of an echogenic liver. Portal vein is patent on color Doppler imaging with normal direction of blood flow towards the liver. Other: None. IMPRESSION: 1. Cholelithiasis.  No evidence of acute cholecystitis. 2. Top-normal common bile duct diameter (6 mm). 3. Diffusely mildly echogenic liver parenchyma, nonspecific, which can be due to hepatic steatosis or fibrosis. Consider outpatient hepatic elastography for further liver fibrosis risk stratification, as clinically warranted. Electronically Signed   By: Ilona Sorrel M.D.   On: 02/03/2020 10:32    Anti-infectives: Anti-infectives (From admission, onward)   Start     Dose/Rate Route Frequency Ordered Stop   02/04/20 1130  clindamycin (CLEOCIN) IVPB 600 mg    Note to Pharmacy: Pharmacy may adjust dosing strength, interval, or rate of medication as needed for optimal therapy for the patient Send with patient on call to the OR.  Anesthesia  to complete antibiotic administration <49mn [facility]   600 mg 100 mL/hr over 30 Minutes Intravenous On call to O.R. 02/04/20 1121 02/04/20 1250   02/04/20 1130  gentamicin (GARAMYCIN) IVPB 100 mg    Note to Pharmacy: Pharmacy may adjust dosing strength, schedule, rate of infusion, etc as needed to optimize therapy Send with patient on call to the OR.  Anesthesia to complete antibiotic administration <643m [facility]  100 mg 200 mL/hr over 30 Minutes Intravenous On  call to O.R. 02/04/20 1121 02/04/20 1307   02/04/20 1122  clindamycin (CLEOCIN) 900 MG/50ML IVPB  Status:  Discontinued    Note to Pharmacy: RoCharmayne Sheer : cabinet override      02/04/20 1122 02/04/20 1129   02/03/20 1800  ciprofloxacin (CIPRO) IVPB 400 mg     400 mg 200 mL/hr over 60 Minutes Intravenous Every 12 hours 02/03/20 1625     02/03/20 1645  metroNIDAZOLE (FLAGYL) IVPB 500 mg     500 mg 100 mL/hr over 60 Minutes Intravenous Every 8 hours 02/03/20 1625     02/03/20 0630  ciprofloxacin (CIPRO) IVPB 400 mg     400 mg 200 mL/hr over 60 Minutes Intravenous  Once 02/03/20 0621 02/03/20 0721   02/03/20 0630  metroNIDAZOLE (FLAGYL) IVPB 500 mg     500 mg 100 mL/hr over 60 Minutes Intravenous  Once 02/03/20 0621 02/03/20 1013      Assessment/Plan: s/p Procedure(s): LAPAROSCOPIC CHOLECYSTECTOMY WITH INTRAOPERATIVE CHOLANGIOGRAM SINGLE SITE, CORE LIVER BIOPSY, BILATERAL TAP BLOCK (N/A)   ERCP planned today.  Can  Have clears and advance as tolerated if ok with GI after ERCP. If all goes well possible d/c tomorrow.   Risk of pancreatitis with ERCP.   LOS: 2 days    FaStark Klein/15/2021

## 2020-02-05 NOTE — Op Note (Signed)
[facility] Patient Name: Vickie Chang Procedure Date: 02/05/2020 MRN: [id number] Attending MD: Ladene Artist , MD Date of Birth: [birth year] CSN: [id number] Age: 74 Admit Type: Inpatient Procedure:                ERCP Indications:              Bile duct stone(s), Filling defect on                            intraoperative cholangiogram, Elevated liver enzymes Providers:                Pricilla Riffle. Fuller Plan, MD, Burtis Junes, RN, Cherylynn Ridges,                            Technician, Adair Laundry CRNA Referring MD:             Michael Boston, MD Medicines:                General Anesthesia Complications:            No immediate complications. Estimated Blood Loss:     Estimated blood loss: none. Procedure:                Pre-Anesthesia Assessment:                           - Prior to the procedure, a History and Physical                            was performed, and patient medications and                            allergies were reviewed. The patient's tolerance of                            previous anesthesia was also reviewed. The risks                            and benefits of the procedure and the sedation                            options and risks were discussed with the patient.                            All questions were answered, and informed consent                            was obtained. Prior Anticoagulants: The patient has                            taken no previous anticoagulant or antiplatelet                            agents. ASA Grade Assessment: II - A patient with  mild systemic disease. After reviewing the risks                            and benefits, the patient was deemed in                            satisfactory condition to undergo the procedure.                           After obtaining informed consent, the scope was                            passed under direct vision. Throughout the   procedure, the patient's blood pressure, pulse, and                            oxygen saturations were monitored continuously. The                            TJF-Q180V ([id number]) Olympus Doudenoscope was                            introduced through the mouth, and used to inject                            contrast into and used to cannulate the bile duct.                            The ERCP was accomplished without difficulty. The                            patient tolerated the procedure well. Scope In: Scope Out: Findings:      A scout film of the abdomen was obtained. Surgical clips, consistent       with a previous cholecystectomy, were seen in the area of the right       upper quadrant of the abdomen. The esophagus was successfully intubated       under direct vision. The scope was advanced to a normal major papilla in       the descending duodenum without detailed examination of the pharynx,       larynx and associated structures, and upper GI tract. The upper GI tract       was grossly normal. A straight Roadrunner wire was passed into the       biliary tree. The short-nosed traction sphincterotome was passed over       the guidewire and the bile duct was then deeply cannulated. Contrast was       injected. I personally interpreted the bile duct images. There was       appropriate flow of contrast through the ducts. A cholecystectomy had       been performed. The biliary tree was not significantly dilated. The       common bile duct contained one stone, which was 6 mm in diameter. A 7 mm       biliary sphincterotomy was made with a traction (standard)       sphincterotome using ERBE  electrocautery. There was no       post-sphincterotomy bleeding. The biliary tree was swept twice with a 9       mm balloon starting at the bifurcation. One stone was removed on the       first pass. No stones remained. Excellent biliary drainage then noted.       The PD was briefly entered once with the  guidewire. Pancreatogram not       performed by intention. Impression:               - Prior cholecystectomy.                           - Choledocholithiasis was found. Complete removal                            was accomplished by biliary sphincterotomy and                            balloon extraction.                           - A biliary sphincterotomy was performed.                           - The biliary tree was swept. Moderate Sedation:      Not Applicable - Patient had care per Anesthesia. Recommendation:           - Avoid aspirin and nonsteroidal anti-inflammatory                            medicines for 1 week post Indocin today.                           - Return patient to hospital ward for ongoing care.                           - Observe patient's clinical course following                            today's ERCP with therapeutic intervention.                           - GI follow up with [facility]: Drs. Schooler and                            Outlaw Procedure Code(s):        --- Professional ---                           [phone], Endoscopic retrograde                            cholangiopancreatography (ERCP); with removal of                            calculi/debris from biliary/pancreatic duct(s)  43262, Endoscopic retrograde                            cholangiopancreatography (ERCP); with                            sphincterotomy/papillotomy Diagnosis Code(s):        --- Professional ---                           Z90.49, Acquired absence of other specified parts                            of digestive tract                           K80.50, Calculus of bile duct without cholangitis                            or cholecystitis without obstruction                           R74.8, Abnormal levels of other serum enzymes                           R93.2, Abnormal findings on diagnostic imaging of                            liver and biliary tract CPT  copyright 2019 American Medical Association. All rights reserved. The codes documented in this report are preliminary and upon coder review may  be revised to meet current compliance requirements. Ladene Artist, MD 02/05/2020 11:30:39 AM This report has been signed electronically. Number of Addenda: 0

## 2020-02-05 NOTE — Anesthesia Postprocedure Evaluation (Signed)
Anesthesia Post Note  Patient: Vickie Chang  Procedure(s) Performed: ENDOSCOPIC RETROGRADE CHOLANGIOPANCREATOGRAPHY (ERCP) (N/A ) SPHINCTEROTOMY REMOVAL OF STONES     Patient location during evaluation: PACU Anesthesia Type: General Level of consciousness: awake Pain management: pain level controlled Vital Signs Assessment: post-procedure vital signs reviewed and stable Respiratory status: spontaneous breathing, nonlabored ventilation, respiratory function stable and patient connected to nasal cannula oxygen Cardiovascular status: blood pressure returned to baseline and stable Postop Assessment: no apparent nausea or vomiting Anesthetic complications: no    Last Vitals:  Vitals:   02/05/20 1158 02/05/20 1341  BP: (!) 157/52 (!) 155/89  Pulse: 73 76  Resp: 19 20  Temp:  36.7 C  SpO2: 95% 97%    Last Pain:  Vitals:   02/05/20 1341  TempSrc: Oral  PainSc:                  Xylia Scherger P Aleksia Freiman

## 2020-02-05 NOTE — Anesthesia Procedure Notes (Signed)
Procedure Name: Intubation Date/Time: 02/05/2020 10:40 AM Performed by: Montel Clock, CRNA Pre-anesthesia Checklist: Patient identified, Emergency Drugs available, Suction available, Patient being monitored and Timeout performed Patient Re-evaluated:Patient Re-evaluated prior to induction Oxygen Delivery Method: Circle system utilized Preoxygenation: Pre-oxygenation with 100% oxygen Induction Type: IV induction Ventilation: Mask ventilation without difficulty Laryngoscope Size: Mac and 3 Grade View: Grade I Tube type: Oral Tube size: 7.0 mm Number of attempts: 1 Airway Equipment and Method: Stylet Placement Confirmation: ETT inserted through vocal cords under direct vision,  positive ETCO2 and breath sounds checked- equal and bilateral Secured at: 21 cm Tube secured with: Tape Dental Injury: Teeth and Oropharynx as per pre-operative assessment

## 2020-02-06 LAB — CBC
HCT: 31.6 % — ABNORMAL LOW (ref 36.0–46.0)
Hemoglobin: 10.3 g/dL — ABNORMAL LOW (ref 12.0–15.0)
MCH: 28.6 pg (ref 26.0–34.0)
MCHC: 32.6 g/dL (ref 30.0–36.0)
MCV: 87.8 fL (ref 80.0–100.0)
Platelets: 158 10*3/uL (ref 150–400)
RBC: 3.6 MIL/uL — ABNORMAL LOW (ref 3.87–5.11)
RDW: 14 % (ref 11.5–15.5)
WBC: 13.6 10*3/uL — ABNORMAL HIGH (ref 4.0–10.5)
nRBC: 0 % (ref 0.0–0.2)

## 2020-02-06 LAB — COMPREHENSIVE METABOLIC PANEL
ALT: 199 U/L — ABNORMAL HIGH (ref 0–44)
AST: 106 U/L — ABNORMAL HIGH (ref 15–41)
Albumin: 3 g/dL — ABNORMAL LOW (ref 3.5–5.0)
Alkaline Phosphatase: 83 U/L (ref 38–126)
Anion gap: 8 (ref 5–15)
BUN: 11 mg/dL (ref 8–23)
CO2: 25 mmol/L (ref 22–32)
Calcium: 8.5 mg/dL — ABNORMAL LOW (ref 8.9–10.3)
Chloride: 109 mmol/L (ref 98–111)
Creatinine, Ser: 0.79 mg/dL (ref 0.44–1.00)
GFR calc Af Amer: >60 mL/min (ref >60)
GFR calc non Af Amer: >60 mL/min (ref >60)
Glucose, Bld: 149 mg/dL — ABNORMAL HIGH (ref 70–99)
Potassium: 3.5 mmol/L (ref 3.5–5.1)
Sodium: 142 mmol/L (ref 135–145)
Total Bilirubin: 0.9 mg/dL (ref 0.3–1.2)
Total Protein: 5.6 g/dL — ABNORMAL LOW (ref 6.5–8.1)

## 2020-02-06 LAB — LIPASE, BLOOD: Lipase: 23 U/L (ref 11–51)

## 2020-02-06 MED ORDER — PANTOPRAZOLE SODIUM 40 MG PO TBEC: 40.0000 mg | DELAYED_RELEASE_TABLET | Freq: Every day | ORAL | 0 refills | Status: AC

## 2020-02-06 MED ORDER — HYDROCODONE-ACETAMINOPHEN 5-325 MG PO TABS: 1.0000 | ORAL_TABLET | Freq: Four times a day (QID) | ORAL | 0 refills | Status: AC | PRN

## 2020-02-06 MED ORDER — PANTOPRAZOLE SODIUM 40 MG PO TBEC: 40.0000 mg | DELAYED_RELEASE_TABLET | Freq: Every day | ORAL | Status: DC

## 2020-02-06 MED ORDER — GABAPENTIN 300 MG PO CAPS: 300.0000 mg | ORAL_CAPSULE | Freq: Two times a day (BID) | ORAL | 0 refills | Status: AC

## 2020-02-06 NOTE — Progress Notes (Signed)
The patient is receiving Protonix by the intravenous route.  Based on criteria approved by the Pharmacy and [place], the medication is being converted to the equivalent oral dose form.  These criteria include: -No active GI bleeding -Able to tolerate diet of full liquids (or better) or tube feeding -Able to tolerate other medications by the oral or enteral route  If you have any questions about this conversion, please contact the Pharmacy Department (phone [phone]).  Thank you.  Minda Ditto PharmD 02/06/2020, 8:19 AM

## 2020-02-06 NOTE — Progress Notes (Signed)
1 Day Post-Op   Subjective/Chief Complaint: Tolerated diet post ERCP.  Common duct stone was extracted.     Objective: Vital signs in last 24 hours: Temp:  [98 F (36.7 C)-99.4 F (37.4 C)] 98.4 F (36.9 C) (05/16 0521) Pulse Rate:  [73-79] 73 (05/16 0521) Resp:  [16-77] 18 (05/16 0521) BP: (113-173)/(52-100) 169/79 (05/16 0521) SpO2:  [95 %-100 %] 99 % (05/16 0521) Last BM Date: 02/02/20  Intake/Output from previous day: 05/15 0701 - 05/16 0700 In: 9673.6 [P.O.:1140; I.V.:2010.2; IV Piggyback:6523.4] Out: 0  Intake/Output this shift: No intake/output data recorded.  General appearance: alert, cooperative, NAD.   Resp: breathing comfortably GI: soft, non distended. approp tender at incision.  Dressing c/d/i. Extremities: extremities normal, atraumatic, no cyanosis or edema  Lab Results:  Recent Labs    02/05/20 0416 02/06/20 0758  WBC 17.6* 13.6*  HGB 11.9* 10.3*  HCT 36.3 31.6*  PLT 188 158   BMET Recent Labs    02/04/20 0438 02/04/20 0438 02/05/20 0416 02/06/20 0758  NA 142  --   --  142  K 3.6   < > 3.5 3.5  CL 107  --   --  109  CO2 25  --   --  25  GLUCOSE 99  --   --  149*  BUN 9  --   --  11  CREATININE 0.85   < > 1.29* 0.79  CALCIUM 8.4*  --   --  8.5*   < > = values in this interval not displayed.   PT/INR Recent Labs    02/04/20 2017  LABPROT 15.2  INR 1.2   ABG No results for input(s): PHART, HCO3 in the last 72 hours.  Invalid input(s): PCO2, PO2  Studies/Results: DG ERCP  Result Date: 02/05/2020 CLINICAL DATA:  ERCP for bile duct stones. EXAM: ERCP TECHNIQUE: Multiple spot images obtained with the fluoroscopic device and submitted for interpretation post-procedure. COMPARISON:  Intraoperative cholangiogram during ERCP-02/04/2020; right upper quadrant abdominal ultrasound-02/03/2020; CT the chest, abdomen and pelvis-02/03/2020 FINDINGS: Seven spot intraoperative fluoroscopic images of the right upper abdominal quadrant during ERCP are  provided for review. Initial image demonstrates an ERCP probe overlying the right upper abdominal quadrant. Surgical clips overlies expected location of the gallbladder fossa. Subsequent images demonstrate selective cannulation and opacification of the CBD. There is a potential nonocclusive filling defect in the distal aspect of the CBD potentially representative of choledocholithiasis as was suggested on preceding intraoperative cholangiogram. Subsequent images demonstrate insufflation of a balloon within the central aspect of the CBD with subsequent biliary sweeping and sphincterotomy. There is minimal opacification of the intrahepatic biliary tree as well as the residual cystic duct. No definitive contrast extravasation to suggest the presence of a biliary leak. IMPRESSION: ERCP with biliary sweeping, stone extraction and presumed sphincterotomy. These images were submitted for radiologic interpretation only. Please see the procedural report for the amount of contrast and the fluoroscopy time utilized. Electronically Signed   By: Sandi Mariscal M.D.   On: 02/05/2020 12:20   DG C-Arm 1-60 Min-No Report  Result Date: 02/04/2020 Fluoroscopy was utilized by the requesting physician.  No radiographic interpretation.    Anti-infectives: Anti-infectives (From admission, onward)   Start     Dose/Rate Route Frequency Ordered Stop   02/05/20 1015  ciprofloxacin (CIPRO) IVPB 400 mg  Status:  Discontinued     400 mg 200 mL/hr over 60 Minutes Intravenous  Once 02/05/20 1004 02/05/20 1020   02/04/20 1130  clindamycin (CLEOCIN) IVPB  600 mg    Note to Pharmacy: Pharmacy may adjust dosing strength, interval, or rate of medication as needed for optimal therapy for the patient Send with patient on call to the OR.  Anesthesia to complete antibiotic administration <71mn [facility]   600 mg 100 mL/hr over 30 Minutes Intravenous On call to O.R. 02/04/20 1121 02/04/20 1250   02/04/20 1130   gentamicin (GARAMYCIN) IVPB 100 mg    Note to Pharmacy: Pharmacy may adjust dosing strength, schedule, rate of infusion, etc as needed to optimize therapy Send with patient on call to the OR.  Anesthesia to complete antibiotic administration <613m [facility]  100 mg 200 mL/hr over 30 Minutes Intravenous On call to O.R. 02/04/20 1121 02/04/20 1307   02/04/20 1122  clindamycin (CLEOCIN) 900 MG/50ML IVPB  Status:  Discontinued    Note to Pharmacy: RoCharmayne Sheer : cabinet override      02/04/20 1122 02/04/20 1129   02/03/20 1800  ciprofloxacin (CIPRO) IVPB 400 mg     400 mg 200 mL/hr over 60 Minutes Intravenous Every 12 hours 02/03/20 1625     02/03/20 1645  metroNIDAZOLE (FLAGYL) IVPB 500 mg     500 mg 100 mL/hr over 60 Minutes Intravenous Every 8 hours 02/03/20 1625     02/03/20 0630  ciprofloxacin (CIPRO) IVPB 400 mg     400 mg 200 mL/hr over 60 Minutes Intravenous  Once 02/03/20 0621 02/03/20 0721   02/03/20 0630  metroNIDAZOLE (FLAGYL) IVPB 500 mg     500 mg 100 mL/hr over 60 Minutes Intravenous  Once 02/03/20 0621 02/03/20 1013      Assessment/Plan: s/p Procedure(s): ENDOSCOPIC RETROGRADE CHOLANGIOPANCREATOGRAPHY (ERCP) (N/A) SPHINCTEROTOMY REMOVAL OF STONES   No evidence of post ercp pancreatitis. Ok to d/c from surgery standpoint. Instructions, follow up and narcotic script done.     LOS: 3 days    FaStark Klein/16/2021

## 2020-02-06 NOTE — Progress Notes (Signed)
[facility] Gastroenterology Progress Note  Vickie Chang 74 y.o. 01-16-1946   Subjective: Feels ok. Having RUQ pain when sitting up. Tolerating solid food. Small formed BM today.  Objective: Vital signs: Vitals:   02/05/20 2122 02/06/20 0521  BP: (!) 173/81 (!) 169/79  Pulse: 79 73  Resp: 16 18  Temp: 98.4 F (36.9 C) 98.4 F (36.9 C)  SpO2: 97% 99%    Physical Exam: Gen: alert, no acute distress HEENT: anicteric sclera CV: RRR Chest: CTA B Abd: RUQ tenderness with guarding, soft, nondistended, +BS Ext: no edema  Lab Results: Recent Labs    02/04/20 0438 02/04/20 0438 02/05/20 0416 02/06/20 0758  NA 142  --   --  142  K 3.6   < > 3.5 3.5  CL 107  --   --  109  CO2 25  --   --  25  GLUCOSE 99  --   --  149*  BUN 9  --   --  11  CREATININE 0.85   < > 1.29* 0.79  CALCIUM 8.4*  --   --  8.5*  MG  --   --  1.8  --    < > = values in this interval not displayed.   Recent Labs    02/05/20 0416 02/06/20 0758  AST 312* 106*  ALT 324* 199*  ALKPHOS 105 83  BILITOT 2.4* 0.9  PROT 6.0* 5.6*  ALBUMIN 3.3* 3.0*   Recent Labs    02/05/20 0416 02/06/20 0758  WBC 17.6* 13.6*  HGB 11.9* 10.3*  HCT 36.3 31.6*  MCV 87.3 87.8  PLT 188 158      Assessment/Plan: Choledocholithiasis removed with sphincterotomy yesterday by Dr. Fuller Plan. Doing ok postop from ERCP and lap chole. LFTs improving. Agree with d/c home. F/U with [facility] (office will call) in 4-6 weeks to arrange outpt colonoscopy.   Vickie Chang 02/06/2020, 11:23 AM  Questions please call [phone] ID: Vickie Chang, female   DOB: June 05, 1946, 74 y.o.   MRN: [id number]

## 2020-02-06 NOTE — Discharge Summary (Signed)
Discharge Summary  Vickie Chang OVZ:[id number] DOB: 11-05-1945  PCP: Harrison Mons, PA  Admit date: 02/03/2020 Discharge date: 02/06/2020   Recommendations for Outpatient Follow-up:  1. Follow-up with PCP in 2 weeks. 2. Follow-up with general surgery in 2 weeks. 3. Follow-up with gastroenterology 4 to 6 weeks for outpatient colonoscopy.  Discharge Diagnoses:  Active Hospital Problems   Diagnosis Date Noted  . Bile duct stone   . Elevated liver function tests   . Abnormal cholangiogram   . Abdominal pain 02/03/2020    Resolved Hospital Problems  No resolved problems to display.    Discharge Condition: Stable   Diet recommendation: Diabetic diet as tolerated.  Vitals:   02/05/20 2122 02/06/20 0521  BP: (!) 173/81 (!) 169/79  Pulse: 79 73  Resp: 16 18  Temp: 98.4 F (36.9 C) 98.4 F (36.9 C)  SpO2: 97% 99%    HPI and Brief Hospital Course:  This is a pleasant 74 year old female with a history of central retinal artery occlusion and right eye blindness in 2016, depression, type 2 diabetes diet-controlled, GERD, hypertension, hyperlipidemia, hypothyroidism admitted via [facility] for evaluation of epigastric pain.  She was diagnosed with cholelithiasis without evidence of cholecystitis, underwent lap chole with positive IOC on 5/14, then subsequently had ERCP with sphincterotomy and stone removal on 5/15.  Today she is doing well, no evidence of post ERCP pancreatitis, tolerating diet and ready for discharge home from the hospital today.  Discharge details, plan of care and follow up instructions were discussed with patient and any available family or care providers. Patient and family are in agreement with discharge from the hospital today and all questions were answered to their satisfaction.  Procedures:  Laparoscopic cholecystectomy 5/14  ERCP with sphincterotomy 5/15  Consultations:  General surgery  Gastroenterology  Discharge Exam: BP  (!) 169/79 (BP Location: Left Arm)   Pulse 73   Temp 98.4 F (36.9 C) (Oral)   Resp 18   Ht 5' (1.524 m)   Wt 65 kg   SpO2 99%   BMI 27.99 kg/m  General:  Alert, oriented, calm, in no acute distress  Eyes: EOMI, clear sclerea Neck: supple, no masses, trachea mildline  Cardiovascular: RRR, no murmurs or rubs, no peripheral edema  Respiratory: clear to auscultation bilaterally, no wheezes, no crackles  Abdomen: soft, nontender, nondistended, normal bowel tones heard  Skin: dry, no rashes  Musculoskeletal: no joint effusions, normal range of motion  Psychiatric: appropriate affect, normal speech  Neurologic: extraocular muscles intact, clear speech, moving all extremities with intact sensorium    Discharge Instructions You were cared for by a hospitalist during your hospital stay. If you have any questions about your discharge medications or the care you received while you were in the hospital after you are discharged, you can call the unit and asked to speak with the hospitalist on call if the hospitalist that took care of you is not available. Once you are discharged, your primary care physician will handle any further medical issues. Please note that NO REFILLS for any discharge medications will be authorized once you are discharged, as it is imperative that you return to your primary care physician (or establish a relationship with a primary care physician if you do not have one) for your aftercare needs so that they can reassess your need for medications and monitor your lab values.  Discharge Instructions    Increase activity slowly   Complete by: As directed  Allergies as of 02/06/2020      Reactions   Sulfa Antibiotics Hives   Tetanus Toxoids Swelling   Codeine Other (See Comments)   gi upset   Penicillins Swelling   Sulfonamide Derivatives Hives      Medication List    TAKE these medications   acetaminophen 325 MG tablet Commonly known as: TYLENOL Take 650 mg by  mouth every 6 (six) hours as needed.   albuterol 108 (90 Base) MCG/ACT inhaler Commonly known as: VENTOLIN HFA Inhale 1 puff into the lungs every 6 (six) hours as needed for wheezing or shortness of breath.   ALPRAZolam 0.25 MG tablet Commonly known as: XANAX Take 0.25 mg by mouth daily as needed for anxiety.   aspirin EC 81 MG tablet Take 81 mg by mouth daily.   atorvastatin 20 MG tablet Commonly known as: LIPITOR Take 1 tablet (20 mg total) by mouth daily at 6 PM. What changed: when to take this   Azelastine-Fluticasone 137-50 MCG/ACT Susp Place 1 spray into both nostrils 2 (two) times daily.   buPROPion 150 MG 12 hr tablet Commonly known as: ZYBAN Take 450 mg by mouth daily.   buPROPion 150 MG 24 hr tablet Commonly known as: WELLBUTRIN XL Take 450 mg by mouth daily.   CENTRUM SILVER ULTRA WOMENS PO Take 1 tablet by mouth daily.   clotrimazole-betamethasone cream Commonly known as: LOTRISONE Apply 1 application topically 2 (two) times daily as needed (for itching).   Dexilant 60 MG capsule Generic drug: dexlansoprazole Take 60 mg by mouth daily.   eszopiclone 3 MG Tabs Generic drug: Eszopiclone Take 3 mg by mouth at bedtime. Take immediately before bedtime   famotidine 20 MG tablet Commonly known as: PEPCID Take 40 mg by mouth 2 (two) times daily.   Flovent HFA 220 MCG/ACT inhaler Generic drug: fluticasone Inhale 1 puff into the lungs daily.   gabapentin 300 MG capsule Commonly known as: NEURONTIN Take 1 capsule (300 mg total) by mouth 2 (two) times daily.   HYDROcodone-acetaminophen 5-325 MG tablet Commonly known as: NORCO/VICODIN Take 1 tablet by mouth every 6 (six) hours as needed for moderate pain.   ketotifen 0.025 % ophthalmic solution Commonly known as: ZADITOR Place 1 drop into both eyes daily.   latanoprost 0.005 % ophthalmic solution Commonly known as: XALATAN Place 1 drop into both eyes at bedtime.   levothyroxine 88 MCG  tablet Commonly known as: SYNTHROID Take 88 mcg by mouth daily.   losartan 100 MG tablet Commonly known as: Cozaar Take 1 tablet (100 mg total) by mouth daily.   nebivolol 5 MG tablet Commonly known as: Bystolic Take 1 tablet (5 mg total) by mouth daily.   ondansetron 4 MG disintegrating tablet Commonly known as: ZOFRAN-ODT Take 4 mg by mouth in the morning, at noon, in the evening, and at bedtime.   pantoprazole 40 MG tablet Commonly known as: PROTONIX Take 1 tablet (40 mg total) by mouth at bedtime.   potassium chloride SA 20 MEQ tablet Commonly known as: KLOR-CON Take 60 mEq by mouth daily.   traMADol 50 MG tablet Commonly known as: ULTRAM Take 50 mg by mouth daily as needed for moderate pain. 1 -2 every 4 hrs. As needed   Xiidra 5 % Soln Generic drug: Lifitegrast Place 1 drop into both eyes at bedtime.      Allergies  Allergen Reactions  . Sulfa Antibiotics Hives  . Tetanus Toxoids Swelling  . Codeine Other (See Comments)    gi  upset  . Penicillins Swelling  . Sulfonamide Derivatives Hives   Follow-up [facility], [place]. Go on 02/22/2020.   Specialty: General Surgery Why: Your appointment is 06/01 at 10:30 am Please arrive 30 minutes prior to your appointment to check in and fill out paperwork. Bring photo ID and insurance information. Contact information: [address] [place] [phone]           The results of significant diagnostics from this hospitalization (including imaging, microbiology, ancillary and laboratory) are listed below for reference.    Significant Diagnostic Studies: CT Head Wo Contrast  Result Date: 02/03/2020 CLINICAL DATA:  Vomiting and dizziness.  Frontal headache EXAM: CT HEAD WITHOUT CONTRAST TECHNIQUE: Contiguous axial images were obtained from the base of the skull through the vertex without intravenous contrast. COMPARISON:  06/03/2015 FINDINGS:  Brain: No evidence of acute infarction, hemorrhage, hydrocephalus, extra-axial collection or mass lesion/mass effect. 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 Vascular: Atherosclerotic calcification. Skull: Normal. Negative for fracture or focal lesion. Sinuses/Orbits: Bilateral cataract resection.  No acute finding. IMPRESSION: 1. No acute finding. 2. Extensive chronic small vessel ischemia in the cerebral white matter. Electronically Signed   By: Monte Fantasia M.D.   On: 02/03/2020 06:08   DG Chest Portable 1 View  Result Date: 02/03/2020 CLINICAL DATA:  Shortness of breath EXAM: PORTABLE CHEST 1 VIEW COMPARISON:  06/03/2015 FINDINGS: Better inflated than before. There is no edema, consolidation, effusion, or pneumothorax. Normal heart size and mediastinal contours. Artifact from EKG leads. IMPRESSION: Negative. Electronically Signed   By: Monte Fantasia M.D.   On: 02/03/2020 05:50   DG ERCP  Result Date: 02/05/2020 CLINICAL DATA:  ERCP for bile duct stones. EXAM: ERCP TECHNIQUE: Multiple spot images obtained with the fluoroscopic device and submitted for interpretation post-procedure. COMPARISON:  Intraoperative cholangiogram during ERCP-02/04/2020; right upper quadrant abdominal ultrasound-02/03/2020; CT the chest, abdomen and pelvis-02/03/2020 FINDINGS: Seven spot intraoperative fluoroscopic images of the right upper abdominal quadrant during ERCP are provided for review. Initial image demonstrates an ERCP probe overlying the right upper abdominal quadrant. Surgical clips overlies expected location of the gallbladder fossa. Subsequent images demonstrate selective cannulation and opacification of the CBD. There is a potential nonocclusive filling defect in the distal aspect of the CBD potentially representative of choledocholithiasis as was suggested on preceding intraoperative cholangiogram. Subsequent images demonstrate insufflation of a balloon within the central  aspect of the CBD with subsequent biliary sweeping and sphincterotomy. There is minimal opacification of the intrahepatic biliary tree as well as the residual cystic duct. No definitive contrast extravasation to suggest the presence of a biliary leak. IMPRESSION: ERCP with biliary sweeping, stone extraction and presumed sphincterotomy. These images were submitted for radiologic interpretation only. Please see the procedural report for the amount of contrast and the fluoroscopy time utilized. Electronically Signed   By: Sandi Mariscal M.D.   On: 02/05/2020 12:20   DG C-Arm 1-60 Min-No Report  Result Date: 02/04/2020 Fluoroscopy was utilized by the requesting physician.  No radiographic interpretation.   CT Angio Chest/Abd/Pel for Dissection W and/or Wo Contrast  Result Date: 02/03/2020 CLINICAL DATA:  Chest pain with nausea and vomiting. EXAM: CT ANGIOGRAPHY CHEST, ABDOMEN AND PELVIS TECHNIQUE: Non-contrast CT of the chest was initially obtained. Multidetector CT imaging through the chest, abdomen and pelvis was performed using the standard protocol during bolus administration of intravenous contrast. Multiplanar reconstructed images and MIPs were obtained and reviewed  to evaluate the vascular anatomy. CONTRAST:  141m OMNIPAQUE IOHEXOL 350 MG/ML SOLN COMPARISON:  Chest x-ray from same day. CT chest, abdomen, and pelvis dated June 03, 2015. FINDINGS: CTA CHEST FINDINGS Cardiovascular: Preferential opacification of the thoracic aorta. No evidence of thoracic aortic aneurysm or dissection. Mild atherosclerotic calcification of the aortic arch and great vessels. Normal heart size. Chronic trace pericardial effusion. No central pulmonary embolism. Mediastinum/Nodes: No enlarged mediastinal, hilar, or axillary lymph nodes. Unchanged 8 mm hypodense nodule small calcifications in the right thyroid lobe, stable since 2016. Not clinically significant; no follow-up imaging recommended. Trachea and esophagus  demonstrate no significant findings. Lungs/Pleura: Lungs are clear. No pleural effusion or pneumothorax. Musculoskeletal: No chest wall abnormality. No acute or significant osseous findings. Chronic mild T10 and T12 superior endplate compression deformities. Review of the MIP images confirms the above findings. CTA ABDOMEN AND PELVIS FINDINGS VASCULAR Aorta: Normal caliber aorta without aneurysm, dissection, vasculitis or significant stenosis. Mild atherosclerotic calcification. Celiac: Patent without evidence of aneurysm, dissection, vasculitis or significant stenosis. SMA: Patent without evidence of aneurysm, dissection, vasculitis or significant stenosis. Renals: Both renal arteries are patent without evidence of aneurysm, dissection, vasculitis, fibromuscular dysplasia or significant stenosis. IMA: Patent without evidence of aneurysm, dissection, vasculitis or significant stenosis. Inflow: Patent without evidence of aneurysm, dissection, vasculitis or significant stenosis. Veins: No obvious venous abnormality within the limitations of this arterial phase study. Review of the MIP images confirms the above findings. NON-VASCULAR Hepatobiliary: No focal liver abnormality is seen. Small gallstones. No gallbladder wall thickening. The common bile duct measures 7 mm, at the upper limits of normal. Pancreas: Unremarkable. No pancreatic ductal dilatation or surrounding inflammatory changes. Spleen: Normal in size without focal abnormality. Adrenals/Urinary Tract: Adrenal glands are unremarkable. Kidneys are normal, without renal calculi, focal lesion, or hydronephrosis. Bladder is unremarkable. Stomach/Bowel: Small hiatal hernia. The stomach is otherwise within normal limits. No bowel wall thickening, distention, or surrounding inflammatory changes. Mild sigmoid diverticulosis. Normal appendix. Lymphatic: No enlarged abdominal or pelvic lymph nodes. Reproductive: Status post hysterectomy. No adnexal masses. Other: No  abdominal wall hernia or abnormality. No abdominopelvic ascites. No pneumoperitoneum. Musculoskeletal: No acute or significant osseous findings. Review of the MIP images confirms the above findings. IMPRESSION: 1. No evidence of acute aortic syndrome or aneurysm. 2. No acute intrathoracic or intra-abdominal findings. 3. Cholelithiasis. 4. Aortic Atherosclerosis (ICD10-I70.0). Electronically Signed   By: WTitus DubinM.D.   On: 02/03/2020 06:44   UKoreaAbdomen Limited RUQ  Result Date: 02/03/2020 CLINICAL DATA:  Right upper quadrant abdominal pain. Cholelithiasis on CT from earlier today. EXAM: ULTRASOUND ABDOMEN LIMITED RIGHT UPPER QUADRANT COMPARISON:  02/03/2020 chest, abdomen and pelvis CT angiogram. FINDINGS: Gallbladder: There is a layering 1.5 cm gallstone in mildly distended gallbladder. No gallbladder wall thickening. No sonographic Murphy's sign. No pericholecystic fluid. Common bile duct: Diameter: 6 mm Liver: Liver parenchyma is diffusely mildly echogenic. No definite liver surface irregularity. No liver masses, noting decreased sensitivity in the setting of an echogenic liver. Portal vein is patent on color Doppler imaging with normal direction of blood flow towards the liver. Other: None. IMPRESSION: 1. Cholelithiasis.  No evidence of acute cholecystitis. 2. Top-normal common bile duct diameter (6 mm). 3. Diffusely mildly echogenic liver parenchyma, nonspecific, which can be due to hepatic steatosis or fibrosis. Consider outpatient hepatic elastography for further liver fibrosis risk stratification, as clinically warranted. Electronically Signed   By: JIlona SorrelM.D.   On: 02/03/2020 10:32    Microbiology: Recent Results (from the  past 240 hour(s))  SARS Coronavirus 2 by RT PCR (hospital order, performed in [facility] hospital lab) Nasopharyngeal Nasopharyngeal Swab     Status: None   Collection Time: 02/03/20  5:13 AM   Specimen: Nasopharyngeal Swab  Result Value Ref Range Status   SARS  Coronavirus 2 NEGATIVE NEGATIVE Final    Comment: (NOTE) SARS-CoV-2 target nucleic acids are NOT DETECTED. The SARS-CoV-2 RNA is generally detectable in upper and lower respiratory specimens during the acute phase of infection. The lowest concentration of SARS-CoV-2 viral copies this assay can detect is 250 copies / mL. A negative result does not preclude SARS-CoV-2 infection and should not be used as the sole basis for treatment or other patient management decisions.  A negative result may occur with improper specimen collection / handling, submission of specimen other than nasopharyngeal swab, presence of viral mutation(s) within the areas targeted by this assay, and inadequate number of viral copies (<250 copies / mL). A negative result must be combined with clinical observations, patient history, and epidemiological information. Fact Sheet for Patients:   StrictlyIdeas.no Fact Sheet for Healthcare Providers: BankingDealers.co.za This test is not yet approved or cleared  by the [place] FDA and has been authorized for detection and/or diagnosis of SARS-CoV-2 by FDA under an Emergency Use Authorization (EUA).  This EUA will remain in effect (meaning this test can be used) for the duration of the COVID-19 declaration under Section 564(b)(1) of the Act, 21 U.S.C. section 360bbb-3(b)(1), unless the authorization is terminated or revoked sooner. Performed at [facility], [address] [place], [place] [address]   Surgical PCR screen     Status: Abnormal   Collection Time: 02/04/20  4:59 AM   Specimen: Nasal Mucosa; Nasal Swab  Result Value Ref Range Status   MRSA, PCR POSITIVE (A) NEGATIVE Final    Comment: RESULT CALLED TO, READ BACK BY AND VERIFIED WITH: WAY,H. RN @0711  ON 5.14.2021 BY [email or web address]    Staphylococcus aureus POSITIVE (A) NEGATIVE Final    Comment: (NOTE) The Xpert SA Assay (FDA approved for NASAL specimens  in patients 83 years of age and older), is one component of a comprehensive surveillance program. It is not intended to diagnose infection nor to guide or monitor treatment. Performed at [facility], [place] [address] [place], [place] [address]      Labs: Basic Metabolic Panel: Recent Labs  Lab 02/03/20 0513 02/04/20 0438 02/05/20 0416 02/06/20 0758  NA 141 142  --  142  K 3.1* 3.6 3.5 3.5  CL 103 107  --  109  CO2 27 25  --  25  GLUCOSE 153* 99  --  149*  BUN 11 9  --  11  CREATININE 0.98 0.85 1.29* 0.79  CALCIUM 8.9 8.4*  --  8.5*  MG  --   --  1.8  --    Liver Function Tests: Recent Labs  Lab 02/03/20 0513 02/04/20 0438 02/05/20 0416 02/06/20 0758  AST 181* 284* 312* 106*  ALT 94* 291* 324* 199*  ALKPHOS 110 107 105 83  BILITOT 1.2 3.9* 2.4* 0.9  PROT 6.9 6.2* 6.0* 5.6*  ALBUMIN 4.0 3.6 3.3* 3.0*   Recent Labs  Lab 02/03/20 0513 02/04/20 0438 02/05/20 0416 02/06/20 0758  LIPASE 81* 20 20 23    No results for input(s): AMMONIA in the last 168 hours. CBC: Recent Labs  Lab 02/03/20 0512 02/04/20 0438 02/05/20 0416 02/06/20 0758  WBC 17.9* 13.2* 17.6* 13.6*  NEUTROABS 16.9*  --   --   --   HGB 13.2 11.6* 11.9* 10.3*  HCT 38.4 35.3* 36.3 31.6*  MCV 84.4 87.4 87.3 87.8  PLT 246 188 188 158   Cardiac Enzymes: No results for input(s): CKTOTAL, CKMB, CKMBINDEX, TROPONINI in the last 168 hours. BNP: BNP (last 3 results) No results for input(s): BNP in the last 8760 hours.  ProBNP (last 3 results) No results for input(s): PROBNP in the last 8760 hours.  CBG: Recent Labs  Lab 02/03/20 1352  GLUCAP 190*    Time spent: 35 minutes were spent in preparing this discharge including medication reconciliation, counseling, and coordination of care.  Signed:  Ossie Beltran Marry Guan, MD  [facility] 02/06/2020, 11:55 AM

## 2020-02-07 ENCOUNTER — Other Ambulatory Visit: Payer: Self-pay

## 2020-02-08 ENCOUNTER — Encounter: Payer: Self-pay | Admitting: *Deleted

## 2020-02-10 LAB — SURGICAL PATHOLOGY

## 2020-09-26 ENCOUNTER — Other Ambulatory Visit: Payer: Self-pay | Admitting: Physician Assistant

## 2020-09-26 DIAGNOSIS — Z1231 Encounter for screening mammogram for malignant neoplasm of breast: Secondary | ICD-10-CM

## 2020-11-03 ENCOUNTER — Ambulatory Visit
Admission: RE | Admit: 2020-11-03 | Discharge: 2020-11-03 | Disposition: A | Discharge status: Home / Self Care | Payer: Medicare Other | Source: Ambulatory Visit | Attending: Physician Assistant | Admitting: Physician Assistant

## 2020-11-03 ENCOUNTER — Other Ambulatory Visit: Payer: Self-pay

## 2020-11-03 DIAGNOSIS — Z1231 Encounter for screening mammogram for malignant neoplasm of breast: Secondary | ICD-10-CM

## 2021-06-23 IMAGING — CT CT HEAD W/O CM
3 series · 15 of 44 positions shown, 18 images · non-contrast
Comparison: 06/03/2015

CLINICAL DATA: Vomiting and dizziness.  Frontal headache

EXAM:
CT HEAD WITHOUT CONTRAST
TECHNIQUE: Contiguous axial images were obtained from the base of the skull
through the vertex without intravenous contrast.

[Series 2: head wo · axial · 0.42mm/px · z∈[+470,+580]mm · 9 of 27 slices shown, 12 images]
[im 3/27  brain]
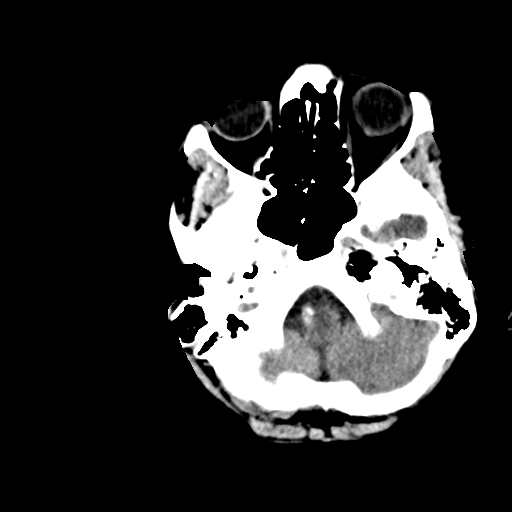
[im 3/27  bone]
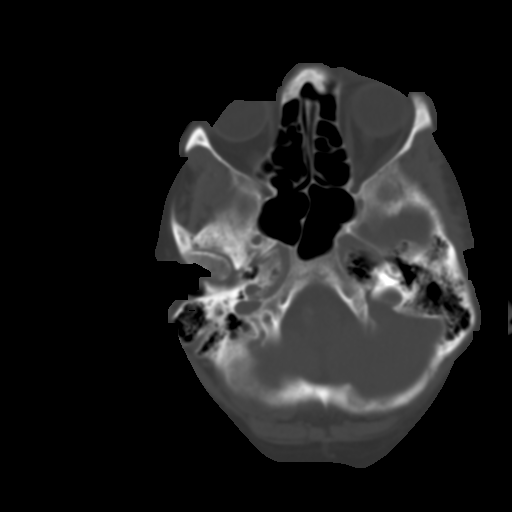
[im 6/27  brain]
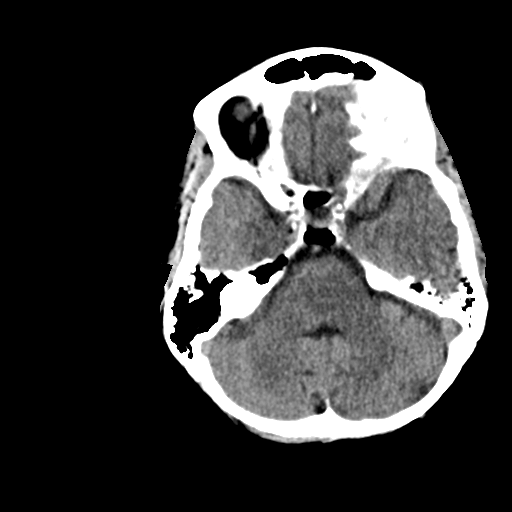
[im 8/27  brain]
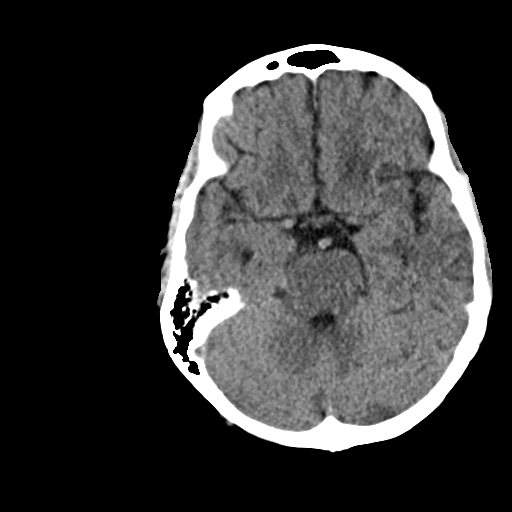
[im 11/27  brain]
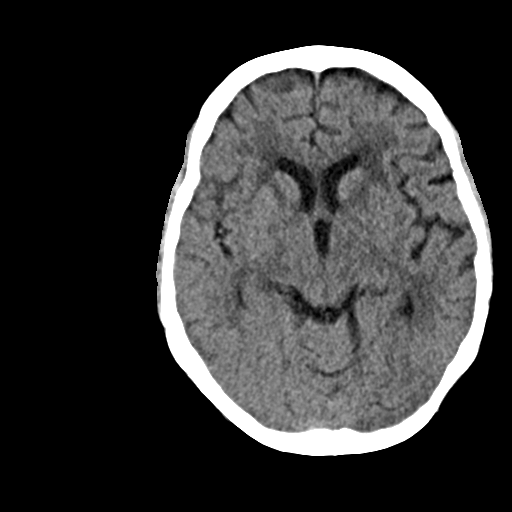
[im 14/27  brain]
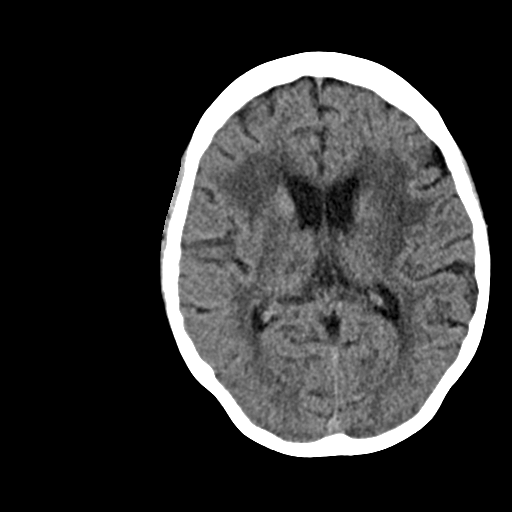
[im 14/27  bone]
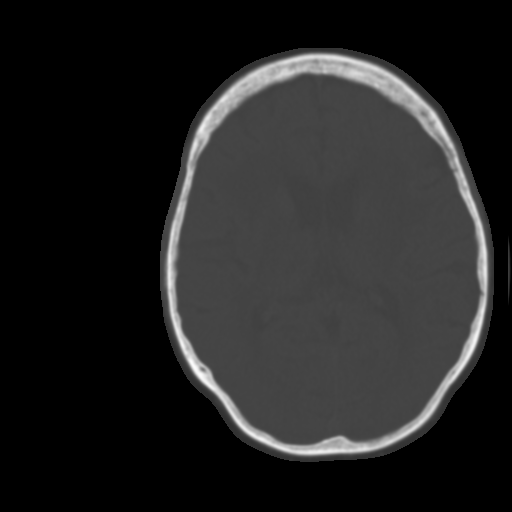
[im 17/27  brain]
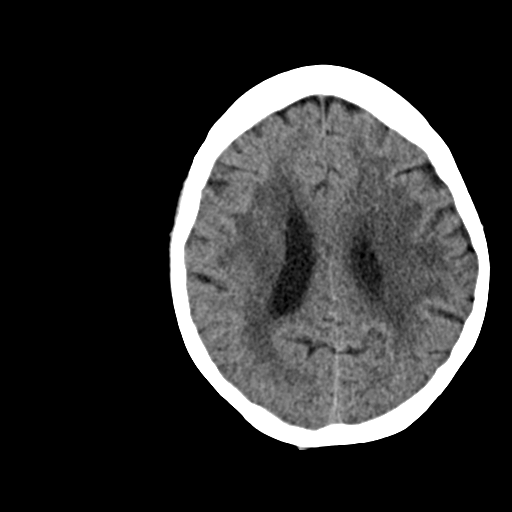
[im 20/27  brain]
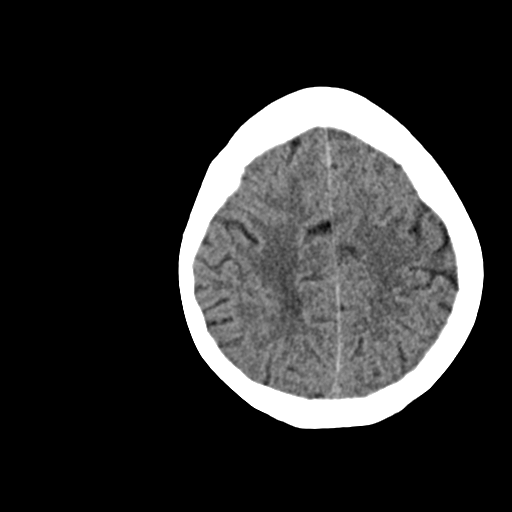
[im 22/27  brain]
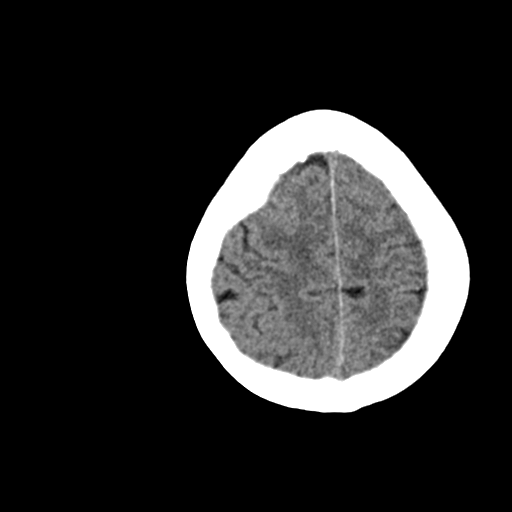
[im 25/27  brain]
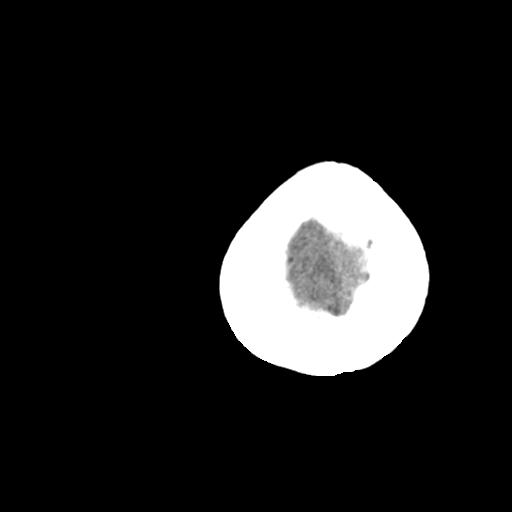
[im 25/27  bone]
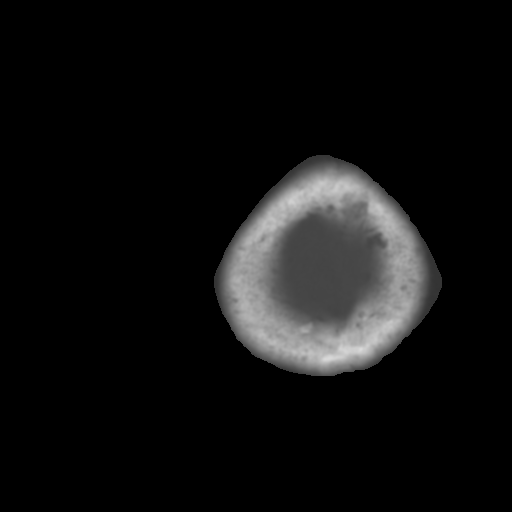

[Series 4: coronal soft · coronal · 0.28mm/px · 3 of 60 slices shown]
[im 20/60  brain]
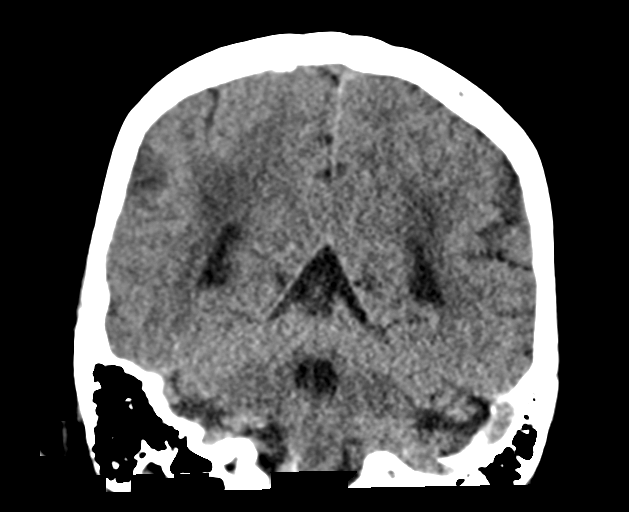
[im 27/60  brain]
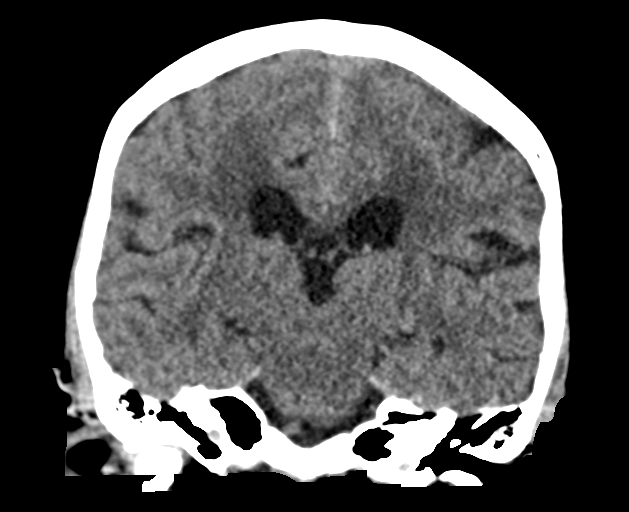
[im 33/60  brain]
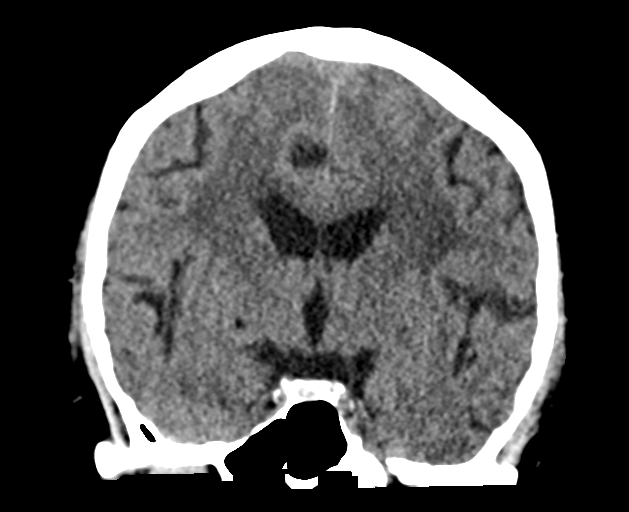

[Series 5: sag soft · sagittal · 0.28mm/px · 3 of 50 slices shown]
[im 17/50  brain]
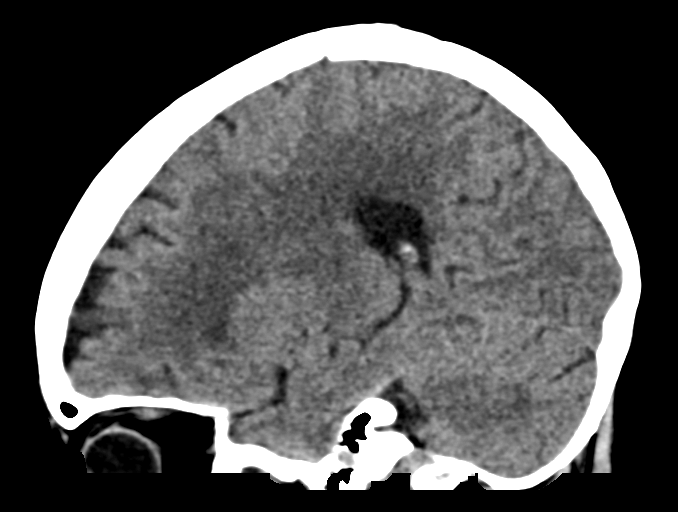
[im 25/50  brain]
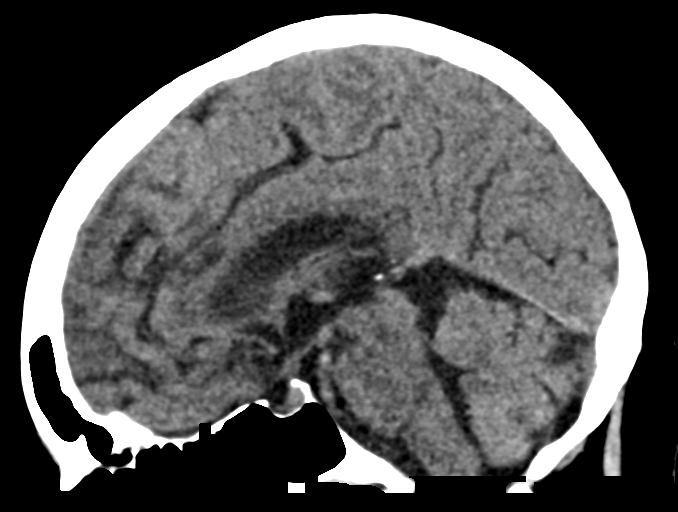
[im 33/50  brain]
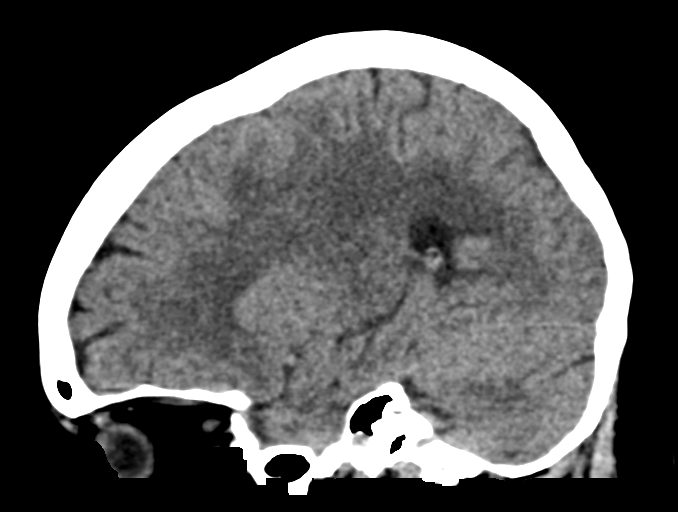

[15 of 44 positions shown; findings below may reference images not displayed]

FINDINGS: Brain: No evidence of acute infarction, hemorrhage, hydrocephalus,
extra-axial collection or mass lesion/mass effect. Extensive chronic
small vessel ischemia in the deep cerebral white matter.

Vascular: Atherosclerotic calcification.

Skull: Normal. Negative for fracture or focal lesion.

Sinuses/Orbits: Bilateral cataract resection.  No acute finding.
IMPRESSION: 1. No acute finding.
2. Extensive chronic small vessel ischemia in the cerebral white
matter.

## 2021-12-03 ENCOUNTER — Other Ambulatory Visit: Payer: Self-pay | Admitting: Physician Assistant

## 2021-12-03 DIAGNOSIS — Z1231 Encounter for screening mammogram for malignant neoplasm of breast: Secondary | ICD-10-CM

## 2021-12-20 ENCOUNTER — Ambulatory Visit: Payer: Medicare Other

## 2022-01-29 ENCOUNTER — Ambulatory Visit
Admission: RE | Admit: 2022-01-29 | Discharge: 2022-01-29 | Disposition: A | Discharge status: Home / Self Care | Payer: Medicare Other | Source: Ambulatory Visit | Attending: Physician Assistant | Admitting: Physician Assistant

## 2022-01-29 ENCOUNTER — Ambulatory Visit: Payer: Medicare Other

## 2022-01-29 DIAGNOSIS — Z1231 Encounter for screening mammogram for malignant neoplasm of breast: Secondary | ICD-10-CM

## 2022-02-21 ENCOUNTER — Other Ambulatory Visit: Payer: Self-pay | Admitting: Physician Assistant

## 2022-02-21 DIAGNOSIS — E2839 Other primary ovarian failure: Secondary | ICD-10-CM

## 2022-06-10 ENCOUNTER — Ambulatory Visit
Admission: RE | Admit: 2022-06-10 | Discharge: 2022-06-10 | Disposition: A | Discharge status: Home / Self Care | Payer: Medicare Other | Source: Ambulatory Visit | Attending: Physician Assistant | Admitting: Physician Assistant

## 2022-06-10 DIAGNOSIS — E2839 Other primary ovarian failure: Secondary | ICD-10-CM

## 2022-06-21 ENCOUNTER — Other Ambulatory Visit: Payer: Self-pay | Admitting: Physician Assistant

## 2022-06-21 DIAGNOSIS — E2839 Other primary ovarian failure: Secondary | ICD-10-CM

## 2023-01-02 ENCOUNTER — Other Ambulatory Visit: Payer: Self-pay | Admitting: Physician Assistant

## 2023-01-02 DIAGNOSIS — Z0000 Encounter for general adult medical examination without abnormal findings: Secondary | ICD-10-CM

## 2023-02-10 ENCOUNTER — Ambulatory Visit: Payer: Medicare Other

## 2023-02-21 ENCOUNTER — Ambulatory Visit: Payer: Medicare Other

## 2023-05-30 ENCOUNTER — Ambulatory Visit: Payer: Medicare Other

## 2023-07-03 ENCOUNTER — Ambulatory Visit
Admission: RE | Admit: 2023-07-03 | Discharge: 2023-07-03 | Disposition: A | Discharge status: Home / Self Care | Payer: Medicare Other | Source: Ambulatory Visit | Attending: Physician Assistant | Admitting: Physician Assistant

## 2023-07-03 DIAGNOSIS — Z0000 Encounter for general adult medical examination without abnormal findings: Secondary | ICD-10-CM

## 2024-06-21 ENCOUNTER — Other Ambulatory Visit: Payer: Self-pay | Admitting: Physician Assistant

## 2024-06-21 DIAGNOSIS — Z1231 Encounter for screening mammogram for malignant neoplasm of breast: Secondary | ICD-10-CM

## 2024-07-06 ENCOUNTER — Ambulatory Visit
Admission: RE | Admit: 2024-07-06 | Discharge: 2024-07-06 | Disposition: A | Discharge status: Home / Self Care | Source: Ambulatory Visit | Attending: Physician Assistant | Admitting: Physician Assistant

## 2024-07-06 DIAGNOSIS — Z1231 Encounter for screening mammogram for malignant neoplasm of breast: Secondary | ICD-10-CM
